# Patient Record
Sex: Female | Born: 1935 | Race: White | Hispanic: No | Smoking: Never smoker
Health system: Southern US, Community
[De-identification: ages and names within clinical notes are randomized; demographics above are authoritative.]

## PROBLEM LIST (undated history)

## (undated) DIAGNOSIS — I509 Heart failure, unspecified: Secondary | ICD-10-CM

## (undated) DIAGNOSIS — Q059 Spina bifida, unspecified: Secondary | ICD-10-CM

## (undated) DIAGNOSIS — L03119 Cellulitis of unspecified part of limb: Secondary | ICD-10-CM

## (undated) DIAGNOSIS — C8590 Non-Hodgkin lymphoma, unspecified, unspecified site: Secondary | ICD-10-CM

## (undated) DIAGNOSIS — L02419 Cutaneous abscess of limb, unspecified: Secondary | ICD-10-CM

## (undated) HISTORY — PX: OTHER SURGICAL HISTORY: SHX169

## (undated) HISTORY — PX: ABDOMINAL HYSTERECTOMY: SHX81

## (undated) HISTORY — PX: KNEE SURGERY: SHX244

---

## 1997-10-06 ENCOUNTER — Encounter: Admission: RE | Admit: 1997-10-06 | Discharge: 1998-01-04 | Payer: Self-pay | Admitting: Orthopedic Surgery

## 1998-02-20 ENCOUNTER — Ambulatory Visit (HOSPITAL_COMMUNITY): Admission: RE | Admit: 1998-02-20 | Discharge: 1998-02-20 | Payer: Self-pay | Admitting: Oncology

## 1998-05-23 ENCOUNTER — Ambulatory Visit (HOSPITAL_COMMUNITY): Admission: RE | Admit: 1998-05-23 | Discharge: 1998-05-23 | Payer: Self-pay | Admitting: Family Medicine

## 1998-05-23 ENCOUNTER — Encounter: Payer: Self-pay | Admitting: Family Medicine

## 1998-06-12 ENCOUNTER — Encounter: Payer: Self-pay | Admitting: Gastroenterology

## 1998-06-12 ENCOUNTER — Ambulatory Visit (HOSPITAL_COMMUNITY): Admission: RE | Admit: 1998-06-12 | Discharge: 1998-06-12 | Payer: Self-pay | Admitting: Gastroenterology

## 1998-12-03 ENCOUNTER — Emergency Department (HOSPITAL_COMMUNITY): Admission: EM | Admit: 1998-12-03 | Discharge: 1998-12-03 | Payer: Self-pay | Admitting: Emergency Medicine

## 1998-12-03 ENCOUNTER — Encounter: Payer: Self-pay | Admitting: Family Medicine

## 1998-12-25 ENCOUNTER — Ambulatory Visit (HOSPITAL_COMMUNITY): Admission: RE | Admit: 1998-12-25 | Discharge: 1998-12-25 | Payer: Self-pay | Admitting: Family Medicine

## 1998-12-25 ENCOUNTER — Encounter: Payer: Self-pay | Admitting: Family Medicine

## 1999-03-17 ENCOUNTER — Emergency Department (HOSPITAL_COMMUNITY): Admission: EM | Admit: 1999-03-17 | Discharge: 1999-03-17 | Payer: Self-pay | Admitting: Emergency Medicine

## 1999-03-17 ENCOUNTER — Encounter: Payer: Self-pay | Admitting: Emergency Medicine

## 1999-05-03 ENCOUNTER — Encounter: Admission: RE | Admit: 1999-05-03 | Discharge: 1999-05-03 | Payer: Self-pay | Admitting: Internal Medicine

## 1999-06-21 ENCOUNTER — Encounter: Admission: RE | Admit: 1999-06-21 | Discharge: 1999-06-21 | Payer: Self-pay | Admitting: Internal Medicine

## 1999-07-13 ENCOUNTER — Ambulatory Visit (HOSPITAL_COMMUNITY): Admission: RE | Admit: 1999-07-13 | Discharge: 1999-07-13 | Payer: Self-pay | Admitting: *Deleted

## 1999-08-16 ENCOUNTER — Encounter: Admission: RE | Admit: 1999-08-16 | Discharge: 1999-08-16 | Payer: Self-pay | Admitting: Internal Medicine

## 2000-01-01 ENCOUNTER — Encounter: Admission: RE | Admit: 2000-01-01 | Discharge: 2000-01-01 | Payer: Self-pay | Admitting: Internal Medicine

## 2000-02-25 ENCOUNTER — Encounter (INDEPENDENT_AMBULATORY_CARE_PROVIDER_SITE_OTHER): Payer: Self-pay

## 2000-02-25 ENCOUNTER — Ambulatory Visit (HOSPITAL_COMMUNITY): Admission: RE | Admit: 2000-02-25 | Discharge: 2000-02-25 | Payer: Self-pay | Admitting: Gastroenterology

## 2000-03-06 ENCOUNTER — Encounter: Payer: Self-pay | Admitting: Gastroenterology

## 2000-03-06 ENCOUNTER — Encounter: Admission: RE | Admit: 2000-03-06 | Discharge: 2000-03-06 | Payer: Self-pay | Admitting: Gastroenterology

## 2000-04-11 ENCOUNTER — Encounter (INDEPENDENT_AMBULATORY_CARE_PROVIDER_SITE_OTHER): Payer: Self-pay | Admitting: Specialist

## 2000-04-11 ENCOUNTER — Ambulatory Visit (HOSPITAL_COMMUNITY): Admission: RE | Admit: 2000-04-11 | Discharge: 2000-04-11 | Payer: Self-pay | Admitting: Gastroenterology

## 2000-06-01 ENCOUNTER — Emergency Department (HOSPITAL_COMMUNITY): Admission: EM | Admit: 2000-06-01 | Discharge: 2000-06-01 | Payer: Self-pay | Admitting: Emergency Medicine

## 2000-06-01 ENCOUNTER — Encounter: Payer: Self-pay | Admitting: *Deleted

## 2000-06-02 ENCOUNTER — Encounter: Admission: RE | Admit: 2000-06-02 | Discharge: 2000-06-02 | Payer: Self-pay | Admitting: Internal Medicine

## 2000-08-13 ENCOUNTER — Encounter: Admission: RE | Admit: 2000-08-13 | Discharge: 2000-08-13 | Payer: Self-pay | Admitting: Oncology

## 2000-08-13 ENCOUNTER — Encounter: Payer: Self-pay | Admitting: Oncology

## 2000-11-17 ENCOUNTER — Encounter: Admission: RE | Admit: 2000-11-17 | Discharge: 2000-11-17 | Payer: Self-pay | Admitting: Internal Medicine

## 2000-11-21 ENCOUNTER — Encounter: Admission: RE | Admit: 2000-11-21 | Discharge: 2000-11-21 | Payer: Self-pay | Admitting: Internal Medicine

## 2000-12-10 ENCOUNTER — Ambulatory Visit (HOSPITAL_COMMUNITY): Admission: RE | Admit: 2000-12-10 | Discharge: 2000-12-10 | Payer: Self-pay | Admitting: Obstetrics & Gynecology

## 2001-03-10 ENCOUNTER — Emergency Department (HOSPITAL_COMMUNITY): Admission: EM | Admit: 2001-03-10 | Discharge: 2001-03-11 | Payer: Self-pay | Admitting: Emergency Medicine

## 2001-03-15 ENCOUNTER — Emergency Department: Admission: EM | Admit: 2001-03-15 | Discharge: 2001-03-15 | Payer: Self-pay | Admitting: Emergency Medicine

## 2001-04-27 ENCOUNTER — Encounter: Admission: RE | Admit: 2001-04-27 | Discharge: 2001-04-27 | Payer: Self-pay | Admitting: Internal Medicine

## 2001-05-19 ENCOUNTER — Encounter: Admission: RE | Admit: 2001-05-19 | Discharge: 2001-05-19 | Payer: Self-pay | Admitting: Internal Medicine

## 2001-05-20 ENCOUNTER — Ambulatory Visit (HOSPITAL_COMMUNITY): Admission: RE | Admit: 2001-05-20 | Discharge: 2001-05-20 | Payer: Self-pay | Admitting: Internal Medicine

## 2001-05-20 ENCOUNTER — Encounter: Payer: Self-pay | Admitting: Internal Medicine

## 2001-06-19 ENCOUNTER — Encounter: Admission: RE | Admit: 2001-06-19 | Discharge: 2001-06-19 | Payer: Self-pay | Admitting: Internal Medicine

## 2001-10-22 ENCOUNTER — Encounter: Admission: RE | Admit: 2001-10-22 | Discharge: 2001-10-22 | Payer: Self-pay | Admitting: Internal Medicine

## 2001-11-30 ENCOUNTER — Emergency Department (HOSPITAL_COMMUNITY): Admission: EM | Admit: 2001-11-30 | Discharge: 2001-11-30 | Payer: Self-pay | Admitting: Emergency Medicine

## 2001-12-02 ENCOUNTER — Encounter (INDEPENDENT_AMBULATORY_CARE_PROVIDER_SITE_OTHER): Payer: Self-pay | Admitting: Specialist

## 2001-12-02 ENCOUNTER — Encounter: Payer: Self-pay | Admitting: Specialist

## 2001-12-03 ENCOUNTER — Encounter: Payer: Self-pay | Admitting: Specialist

## 2001-12-03 ENCOUNTER — Inpatient Hospital Stay (HOSPITAL_COMMUNITY): Admission: EM | Admit: 2001-12-03 | Discharge: 2001-12-06 | Payer: Self-pay | Admitting: Specialist

## 2001-12-08 ENCOUNTER — Encounter: Payer: Self-pay | Admitting: Internal Medicine

## 2001-12-08 ENCOUNTER — Inpatient Hospital Stay (HOSPITAL_COMMUNITY): Admission: EM | Admit: 2001-12-08 | Discharge: 2001-12-12 | Payer: Self-pay

## 2002-05-12 ENCOUNTER — Ambulatory Visit (HOSPITAL_COMMUNITY): Admission: RE | Admit: 2002-05-12 | Discharge: 2002-05-12 | Payer: Self-pay | Admitting: Oncology

## 2002-05-12 ENCOUNTER — Encounter: Payer: Self-pay | Admitting: Oncology

## 2002-08-19 ENCOUNTER — Encounter: Admission: RE | Admit: 2002-08-19 | Discharge: 2002-08-19 | Payer: Self-pay | Admitting: Internal Medicine

## 2002-08-23 ENCOUNTER — Encounter: Payer: Self-pay | Admitting: Orthopedic Surgery

## 2002-08-23 ENCOUNTER — Encounter: Admission: RE | Admit: 2002-08-23 | Discharge: 2002-08-23 | Payer: Self-pay | Admitting: Orthopedic Surgery

## 2002-08-24 ENCOUNTER — Encounter (INDEPENDENT_AMBULATORY_CARE_PROVIDER_SITE_OTHER): Payer: Self-pay | Admitting: Specialist

## 2002-08-24 ENCOUNTER — Ambulatory Visit (HOSPITAL_BASED_OUTPATIENT_CLINIC_OR_DEPARTMENT_OTHER): Admission: RE | Admit: 2002-08-24 | Discharge: 2002-08-25 | Payer: Self-pay | Admitting: Orthopedic Surgery

## 2002-08-27 ENCOUNTER — Inpatient Hospital Stay (HOSPITAL_COMMUNITY): Admission: RE | Admit: 2002-08-27 | Discharge: 2002-09-02 | Payer: Self-pay | Admitting: Internal Medicine

## 2002-12-03 ENCOUNTER — Encounter: Admission: RE | Admit: 2002-12-03 | Discharge: 2002-12-03 | Payer: Self-pay | Admitting: Internal Medicine

## 2002-12-15 ENCOUNTER — Ambulatory Visit (HOSPITAL_COMMUNITY): Admission: RE | Admit: 2002-12-15 | Discharge: 2002-12-15 | Payer: Self-pay | Admitting: Internal Medicine

## 2002-12-15 ENCOUNTER — Encounter: Payer: Self-pay | Admitting: Internal Medicine

## 2002-12-20 ENCOUNTER — Encounter: Admission: RE | Admit: 2002-12-20 | Discharge: 2002-12-20 | Payer: Self-pay | Admitting: Internal Medicine

## 2002-12-21 ENCOUNTER — Encounter: Admission: RE | Admit: 2002-12-21 | Discharge: 2002-12-21 | Payer: Self-pay | Admitting: Sports Medicine

## 2002-12-21 ENCOUNTER — Encounter: Payer: Self-pay | Admitting: Sports Medicine

## 2003-04-05 ENCOUNTER — Encounter: Admission: RE | Admit: 2003-04-05 | Discharge: 2003-04-05 | Payer: Self-pay | Admitting: Internal Medicine

## 2003-04-15 ENCOUNTER — Encounter: Admission: RE | Admit: 2003-04-15 | Discharge: 2003-04-15 | Payer: Self-pay | Admitting: Internal Medicine

## 2003-05-02 ENCOUNTER — Encounter: Admission: RE | Admit: 2003-05-02 | Discharge: 2003-05-02 | Payer: Self-pay | Admitting: Internal Medicine

## 2003-05-17 ENCOUNTER — Emergency Department (HOSPITAL_COMMUNITY): Admission: EM | Admit: 2003-05-17 | Discharge: 2003-05-17 | Payer: Self-pay | Admitting: Emergency Medicine

## 2003-05-27 ENCOUNTER — Ambulatory Visit (HOSPITAL_COMMUNITY): Admission: RE | Admit: 2003-05-27 | Discharge: 2003-05-27 | Payer: Self-pay | Admitting: Emergency Medicine

## 2003-07-05 ENCOUNTER — Ambulatory Visit (HOSPITAL_COMMUNITY): Admission: RE | Admit: 2003-07-05 | Discharge: 2003-07-05 | Payer: Self-pay | Admitting: Neurology

## 2003-12-16 ENCOUNTER — Ambulatory Visit (HOSPITAL_COMMUNITY): Admission: RE | Admit: 2003-12-16 | Discharge: 2003-12-16 | Payer: Self-pay | Admitting: Internal Medicine

## 2004-05-24 ENCOUNTER — Ambulatory Visit: Payer: Self-pay | Admitting: Internal Medicine

## 2004-06-08 ENCOUNTER — Ambulatory Visit: Payer: Self-pay | Admitting: Oncology

## 2004-06-08 ENCOUNTER — Ambulatory Visit: Payer: Self-pay | Admitting: Internal Medicine

## 2004-06-14 ENCOUNTER — Encounter (INDEPENDENT_AMBULATORY_CARE_PROVIDER_SITE_OTHER): Payer: Self-pay | Admitting: *Deleted

## 2004-06-14 ENCOUNTER — Ambulatory Visit: Admission: RE | Admit: 2004-06-14 | Discharge: 2004-06-14 | Payer: Self-pay | Admitting: Oncology

## 2004-06-29 ENCOUNTER — Ambulatory Visit: Payer: Self-pay | Admitting: Internal Medicine

## 2004-06-29 ENCOUNTER — Inpatient Hospital Stay (HOSPITAL_COMMUNITY): Admission: RE | Admit: 2004-06-29 | Discharge: 2004-07-05 | Payer: Self-pay | Admitting: Orthopedic Surgery

## 2004-06-29 ENCOUNTER — Ambulatory Visit: Payer: Self-pay | Admitting: Physical Medicine & Rehabilitation

## 2004-12-21 ENCOUNTER — Ambulatory Visit: Payer: Self-pay | Admitting: Internal Medicine

## 2004-12-24 ENCOUNTER — Emergency Department (HOSPITAL_COMMUNITY): Admission: EM | Admit: 2004-12-24 | Discharge: 2004-12-24 | Payer: Self-pay | Admitting: Emergency Medicine

## 2005-06-06 ENCOUNTER — Ambulatory Visit: Payer: Self-pay | Admitting: Oncology

## 2005-07-04 ENCOUNTER — Encounter: Admission: RE | Admit: 2005-07-04 | Discharge: 2005-07-04 | Payer: Self-pay | Admitting: Oncology

## 2005-07-24 ENCOUNTER — Encounter: Admission: RE | Admit: 2005-07-24 | Discharge: 2005-07-24 | Payer: Self-pay | Admitting: Oncology

## 2006-04-30 ENCOUNTER — Encounter: Admission: RE | Admit: 2006-04-30 | Discharge: 2006-04-30 | Payer: Self-pay | Admitting: Oncology

## 2006-06-21 ENCOUNTER — Emergency Department (HOSPITAL_COMMUNITY): Admission: EM | Admit: 2006-06-21 | Discharge: 2006-06-21 | Payer: Self-pay | Admitting: Emergency Medicine

## 2006-06-23 ENCOUNTER — Ambulatory Visit: Payer: Self-pay | Admitting: Oncology

## 2006-07-07 ENCOUNTER — Encounter: Admission: RE | Admit: 2006-07-07 | Discharge: 2006-07-07 | Payer: Self-pay | Admitting: Internal Medicine

## 2006-07-25 ENCOUNTER — Emergency Department (HOSPITAL_COMMUNITY): Admission: EM | Admit: 2006-07-25 | Discharge: 2006-07-25 | Payer: Self-pay | Admitting: Emergency Medicine

## 2006-07-28 LAB — CBC WITH DIFFERENTIAL/PLATELET
BASO%: 0.2 % (ref 0.0–2.0)
EOS%: 10.1 % — ABNORMAL HIGH (ref 0.0–7.0)
MCH: 30.4 pg (ref 26.0–34.0)
MCHC: 33.6 g/dL (ref 32.0–36.0)
MCV: 90.7 fL (ref 81.0–101.0)
MONO%: 7.5 % (ref 0.0–13.0)
RBC: 4.21 10*6/uL (ref 3.70–5.32)
RDW: 12.3 % (ref 11.3–14.5)
lymph#: 0.6 10*3/uL — ABNORMAL LOW (ref 0.9–3.3)

## 2006-08-18 ENCOUNTER — Inpatient Hospital Stay (HOSPITAL_COMMUNITY): Admission: EM | Admit: 2006-08-18 | Discharge: 2006-08-24 | Payer: Self-pay | Admitting: Emergency Medicine

## 2007-01-02 ENCOUNTER — Ambulatory Visit: Payer: Self-pay | Admitting: *Deleted

## 2007-01-02 ENCOUNTER — Ambulatory Visit: Admission: RE | Admit: 2007-01-02 | Discharge: 2007-01-02 | Payer: Self-pay | Admitting: Urology

## 2007-01-21 ENCOUNTER — Encounter (INDEPENDENT_AMBULATORY_CARE_PROVIDER_SITE_OTHER): Payer: Self-pay | Admitting: Internal Medicine

## 2007-01-21 ENCOUNTER — Ambulatory Visit (HOSPITAL_COMMUNITY): Admission: RE | Admit: 2007-01-21 | Discharge: 2007-01-21 | Payer: Self-pay | Admitting: Hospitalist

## 2007-01-21 ENCOUNTER — Ambulatory Visit: Payer: Self-pay | Admitting: Hospitalist

## 2007-01-21 ENCOUNTER — Encounter (INDEPENDENT_AMBULATORY_CARE_PROVIDER_SITE_OTHER): Payer: Self-pay | Admitting: *Deleted

## 2007-01-21 DIAGNOSIS — Q059 Spina bifida, unspecified: Secondary | ICD-10-CM

## 2007-01-21 DIAGNOSIS — E785 Hyperlipidemia, unspecified: Secondary | ICD-10-CM | POA: Insufficient documentation

## 2007-01-21 DIAGNOSIS — K219 Gastro-esophageal reflux disease without esophagitis: Secondary | ICD-10-CM | POA: Insufficient documentation

## 2007-01-21 DIAGNOSIS — B009 Herpesviral infection, unspecified: Secondary | ICD-10-CM | POA: Insufficient documentation

## 2007-01-21 DIAGNOSIS — R1900 Intra-abdominal and pelvic swelling, mass and lump, unspecified site: Secondary | ICD-10-CM | POA: Insufficient documentation

## 2007-01-21 DIAGNOSIS — C8589 Other specified types of non-Hodgkin lymphoma, extranodal and solid organ sites: Secondary | ICD-10-CM | POA: Insufficient documentation

## 2007-01-21 DIAGNOSIS — Z9079 Acquired absence of other genital organ(s): Secondary | ICD-10-CM | POA: Insufficient documentation

## 2007-01-21 DIAGNOSIS — N318 Other neuromuscular dysfunction of bladder: Secondary | ICD-10-CM | POA: Insufficient documentation

## 2007-01-21 DIAGNOSIS — D709 Neutropenia, unspecified: Secondary | ICD-10-CM

## 2007-01-21 DIAGNOSIS — M1990 Unspecified osteoarthritis, unspecified site: Secondary | ICD-10-CM | POA: Insufficient documentation

## 2007-01-21 DIAGNOSIS — R609 Edema, unspecified: Secondary | ICD-10-CM | POA: Insufficient documentation

## 2007-01-21 DIAGNOSIS — D259 Leiomyoma of uterus, unspecified: Secondary | ICD-10-CM | POA: Insufficient documentation

## 2007-01-22 LAB — CONVERTED CEMR LAB
ALT: 19 units/L (ref 0–35)
Albumin: 3.7 g/dL (ref 3.5–5.2)
Alkaline Phosphatase: 96 units/L (ref 39–117)
CO2: 28 meq/L (ref 19–32)
Chloride: 101 meq/L (ref 96–112)
Creatinine, Ser: 0.61 mg/dL (ref 0.40–1.20)
Pro B Natriuretic peptide (BNP): 66 pg/mL (ref 0.0–100.0)
Sodium: 137 meq/L (ref 135–145)

## 2007-01-23 ENCOUNTER — Ambulatory Visit: Payer: Self-pay | Admitting: Oncology

## 2007-01-27 LAB — COMPREHENSIVE METABOLIC PANEL
Alkaline Phosphatase: 88 U/L (ref 39–117)
BUN: 14 mg/dL (ref 6–23)
Glucose, Bld: 94 mg/dL (ref 70–99)
Sodium: 141 mEq/L (ref 135–145)
Total Bilirubin: 1.5 mg/dL — ABNORMAL HIGH (ref 0.3–1.2)

## 2007-01-27 LAB — CBC WITH DIFFERENTIAL/PLATELET
EOS%: 6.1 % (ref 0.0–7.0)
Eosinophils Absolute: 0.1 10*3/uL (ref 0.0–0.5)
LYMPH%: 44.2 % (ref 14.0–48.0)
MCH: 32.3 pg (ref 26.0–34.0)
MCV: 91.9 fL (ref 81.0–101.0)
MONO%: 15.1 % — ABNORMAL HIGH (ref 0.0–13.0)
NEUT#: 0.4 10*3/uL — CL (ref 1.5–6.5)
Platelets: 149 10*3/uL (ref 145–400)
RBC: 3.65 10*6/uL — ABNORMAL LOW (ref 3.70–5.32)

## 2007-01-27 LAB — URINALYSIS, MICROSCOPIC - CHCC
Bilirubin (Urine): NEGATIVE
Ketones: NEGATIVE mg/dL
pH: 6.5 (ref 4.6–8.0)

## 2007-01-29 LAB — URINE CULTURE

## 2007-02-03 ENCOUNTER — Encounter (INDEPENDENT_AMBULATORY_CARE_PROVIDER_SITE_OTHER): Payer: Self-pay | Admitting: Orthopaedic Surgery

## 2007-02-03 ENCOUNTER — Ambulatory Visit (HOSPITAL_COMMUNITY): Admission: RE | Admit: 2007-02-03 | Discharge: 2007-02-03 | Payer: Self-pay | Admitting: Internal Medicine

## 2007-02-04 ENCOUNTER — Encounter (INDEPENDENT_AMBULATORY_CARE_PROVIDER_SITE_OTHER): Payer: Self-pay | Admitting: *Deleted

## 2007-02-04 ENCOUNTER — Encounter (INDEPENDENT_AMBULATORY_CARE_PROVIDER_SITE_OTHER): Payer: Self-pay | Admitting: Internal Medicine

## 2007-02-04 ENCOUNTER — Ambulatory Visit: Payer: Self-pay | Admitting: Internal Medicine

## 2007-02-05 ENCOUNTER — Encounter (INDEPENDENT_AMBULATORY_CARE_PROVIDER_SITE_OTHER): Payer: Self-pay | Admitting: *Deleted

## 2007-04-13 ENCOUNTER — Telehealth: Payer: Self-pay | Admitting: *Deleted

## 2007-04-14 ENCOUNTER — Ambulatory Visit (HOSPITAL_COMMUNITY): Admission: RE | Admit: 2007-04-14 | Discharge: 2007-04-14 | Payer: Self-pay | Admitting: *Deleted

## 2007-04-14 ENCOUNTER — Ambulatory Visit: Payer: Self-pay | Admitting: *Deleted

## 2007-04-14 LAB — CONVERTED CEMR LAB
Basophils Absolute: 0 10*3/uL (ref 0.0–0.1)
HCT: 35.8 % — ABNORMAL LOW (ref 36.0–46.0)
Hemoglobin: 12.2 g/dL (ref 12.0–15.0)
Lymphocytes Relative: 41 % (ref 12–46)
MCHC: 34.2 g/dL (ref 30.0–36.0)
Monocytes Relative: 16 % — ABNORMAL HIGH (ref 3–11)
Neutro Abs: 0.3 10*3/uL — ABNORMAL LOW (ref 1.7–7.7)
Neutrophils Relative %: 19 % — ABNORMAL LOW (ref 43–77)
RBC: 3.94 M/uL (ref 3.87–5.11)

## 2007-05-04 ENCOUNTER — Ambulatory Visit: Payer: Self-pay | Admitting: Infectious Diseases

## 2007-05-04 ENCOUNTER — Telehealth (INDEPENDENT_AMBULATORY_CARE_PROVIDER_SITE_OTHER): Payer: Self-pay | Admitting: *Deleted

## 2007-05-04 ENCOUNTER — Ambulatory Visit (HOSPITAL_COMMUNITY): Admission: RE | Admit: 2007-05-04 | Discharge: 2007-05-04 | Payer: Self-pay | Admitting: Infectious Diseases

## 2007-05-04 LAB — CONVERTED CEMR LAB
AST: 20 units/L (ref 0–37)
Alkaline Phosphatase: 103 units/L (ref 39–117)
BUN: 16 mg/dL (ref 6–23)
Basophils Absolute: 0 10*3/uL (ref 0.0–0.1)
Basophils Relative: 0 % (ref 0–1)
Calcium: 9.3 mg/dL (ref 8.4–10.5)
Chloride: 101 meq/L (ref 96–112)
Creatinine, Ser: 0.6 mg/dL (ref 0.40–1.20)
Glucose, Bld: 96 mg/dL (ref 70–99)
Lymphs Abs: 0.6 10*3/uL — ABNORMAL LOW (ref 0.7–3.3)
MCHC: 33.9 g/dL (ref 30.0–36.0)
MCV: 90.8 fL (ref 78.0–100.0)
Monocytes Relative: 11 % (ref 3–11)
Neutro Abs: 0.6 10*3/uL — ABNORMAL LOW (ref 1.7–7.7)
Neutrophils Relative %: 35 % — ABNORMAL LOW (ref 43–77)
Potassium: 4.2 meq/L (ref 3.5–5.3)
Sodium: 138 meq/L (ref 135–145)
Total Bilirubin: 0.7 mg/dL (ref 0.3–1.2)
Total Protein: 6.8 g/dL (ref 6.0–8.3)

## 2007-05-19 ENCOUNTER — Ambulatory Visit: Payer: Self-pay | Admitting: Internal Medicine

## 2007-06-06 ENCOUNTER — Emergency Department (HOSPITAL_COMMUNITY): Admission: EM | Admit: 2007-06-06 | Discharge: 2007-06-06 | Payer: Self-pay | Admitting: Emergency Medicine

## 2007-07-28 ENCOUNTER — Ambulatory Visit: Payer: Self-pay | Admitting: Oncology

## 2007-07-29 ENCOUNTER — Encounter (INDEPENDENT_AMBULATORY_CARE_PROVIDER_SITE_OTHER): Payer: Self-pay | Admitting: *Deleted

## 2007-07-30 LAB — MORPHOLOGY
PLT EST: ADEQUATE
RBC Comments: NORMAL

## 2007-07-30 LAB — URINALYSIS, MICROSCOPIC - CHCC
Glucose: NEGATIVE g/dL
Nitrite: POSITIVE

## 2007-07-30 LAB — CBC WITH DIFFERENTIAL/PLATELET
BASO%: 2.1 % — ABNORMAL HIGH (ref 0.0–2.0)
EOS%: 20.8 % — ABNORMAL HIGH (ref 0.0–7.0)
MCH: 30.5 pg (ref 26.0–34.0)
MCHC: 34.6 g/dL (ref 32.0–36.0)
RDW: 10.1 % — ABNORMAL LOW (ref 11.3–14.5)
lymph#: 0.5 10*3/uL — ABNORMAL LOW (ref 0.9–3.3)

## 2007-08-01 LAB — COMPREHENSIVE METABOLIC PANEL
ALT: 12 U/L (ref 0–35)
AST: 17 U/L (ref 0–37)
Albumin: 4.3 g/dL (ref 3.5–5.2)
Calcium: 9.2 mg/dL (ref 8.4–10.5)
Chloride: 104 mEq/L (ref 96–112)
Potassium: 4 mEq/L (ref 3.5–5.3)

## 2007-09-11 ENCOUNTER — Ambulatory Visit: Payer: Self-pay | Admitting: Oncology

## 2007-09-15 LAB — CBC WITH DIFFERENTIAL/PLATELET
BASO%: 0.7 % (ref 0.0–2.0)
HCT: 38.4 % (ref 34.8–46.6)
MCHC: 34.9 g/dL (ref 32.0–36.0)
MONO#: 0.2 10*3/uL (ref 0.1–0.9)
NEUT%: 22.4 % — ABNORMAL LOW (ref 39.6–76.8)
RDW: 13 % (ref 11.3–14.5)
WBC: 1.6 10*3/uL — ABNORMAL LOW (ref 3.9–10.0)
lymph#: 0.7 10*3/uL — ABNORMAL LOW (ref 0.9–3.3)

## 2007-09-15 LAB — URINALYSIS, MICROSCOPIC - CHCC
Bilirubin (Urine): NEGATIVE
Glucose: NEGATIVE g/dL
Ketones: NEGATIVE mg/dL
Protein: NEGATIVE mg/dL
Specific Gravity, Urine: 1.015 (ref 1.003–1.035)

## 2007-09-15 LAB — MORPHOLOGY: PLT EST: ADEQUATE

## 2007-09-16 LAB — URINE CULTURE

## 2007-11-12 ENCOUNTER — Ambulatory Visit: Payer: Self-pay | Admitting: Oncology

## 2007-11-18 LAB — CBC WITH DIFFERENTIAL/PLATELET
Eosinophils Absolute: 0.5 10*3/uL (ref 0.0–0.5)
HCT: 33.9 % — ABNORMAL LOW (ref 34.8–46.6)
HGB: 11.9 g/dL (ref 11.6–15.9)
LYMPH%: 34.7 % (ref 14.0–48.0)
MONO#: 0.2 10*3/uL (ref 0.1–0.9)
NEUT#: 0.5 10*3/uL — ABNORMAL LOW (ref 1.5–6.5)
NEUT%: 24.6 % — ABNORMAL LOW (ref 39.6–76.8)
Platelets: 200 10*3/uL (ref 145–400)
RBC: 3.74 10*6/uL (ref 3.70–5.32)
WBC: 1.9 10*3/uL — ABNORMAL LOW (ref 3.9–10.0)

## 2007-11-19 ENCOUNTER — Emergency Department (HOSPITAL_COMMUNITY): Admission: EM | Admit: 2007-11-19 | Discharge: 2007-11-19 | Payer: Self-pay | Admitting: Emergency Medicine

## 2007-11-25 ENCOUNTER — Ambulatory Visit: Payer: Self-pay | Admitting: *Deleted

## 2007-12-10 ENCOUNTER — Ambulatory Visit: Payer: Self-pay | Admitting: *Deleted

## 2008-01-12 ENCOUNTER — Ambulatory Visit: Payer: Self-pay | Admitting: Oncology

## 2008-01-21 LAB — CBC WITH DIFFERENTIAL/PLATELET
Eosinophils Absolute: 0.4 10*3/uL (ref 0.0–0.5)
MONO#: 0.3 10*3/uL (ref 0.1–0.9)
NEUT#: 0.4 10*3/uL — CL (ref 1.5–6.5)
RBC: 3.92 10*6/uL (ref 3.70–5.32)
RDW: 11.9 % (ref 11.3–14.5)
WBC: 1.6 10*3/uL — ABNORMAL LOW (ref 3.9–10.0)

## 2008-01-26 ENCOUNTER — Emergency Department (HOSPITAL_COMMUNITY): Admission: EM | Admit: 2008-01-26 | Discharge: 2008-01-27 | Payer: Self-pay | Admitting: Emergency Medicine

## 2008-02-01 ENCOUNTER — Ambulatory Visit: Payer: Self-pay | Admitting: Internal Medicine

## 2008-03-07 ENCOUNTER — Ambulatory Visit: Payer: Self-pay | Admitting: Internal Medicine

## 2008-03-07 DIAGNOSIS — J069 Acute upper respiratory infection, unspecified: Secondary | ICD-10-CM | POA: Insufficient documentation

## 2008-03-22 ENCOUNTER — Ambulatory Visit: Payer: Self-pay | Admitting: Oncology

## 2008-03-24 LAB — CBC WITH DIFFERENTIAL/PLATELET
EOS%: 26.8 % — ABNORMAL HIGH (ref 0.0–7.0)
Eosinophils Absolute: 0.3 10*3/uL (ref 0.0–0.5)
MCH: 31.1 pg (ref 26.0–34.0)
MCV: 89.8 fL (ref 81.0–101.0)
MONO%: 17.4 % — ABNORMAL HIGH (ref 0.0–13.0)
NEUT#: 0.1 10*3/uL — CL (ref 1.5–6.5)
RBC: 4.07 10*6/uL (ref 3.70–5.32)
RDW: 12.8 % (ref 11.3–14.5)
lymph#: 0.5 10*3/uL — ABNORMAL LOW (ref 0.9–3.3)

## 2008-04-04 ENCOUNTER — Emergency Department (HOSPITAL_COMMUNITY): Admission: EM | Admit: 2008-04-04 | Discharge: 2008-04-04 | Payer: Self-pay | Admitting: Emergency Medicine

## 2008-04-12 ENCOUNTER — Ambulatory Visit: Payer: Self-pay | Admitting: Pulmonary Disease

## 2008-04-12 DIAGNOSIS — R634 Abnormal weight loss: Secondary | ICD-10-CM | POA: Insufficient documentation

## 2008-04-20 LAB — CBC WITH DIFFERENTIAL/PLATELET
Basophils Absolute: 0 10*3/uL (ref 0.0–0.1)
Eosinophils Absolute: 0.5 10*3/uL (ref 0.0–0.5)
HCT: 33.6 % — ABNORMAL LOW (ref 34.8–46.6)
HGB: 11.6 g/dL (ref 11.6–15.9)
LYMPH%: 33.4 % (ref 14.0–48.0)
MCV: 89.8 fL (ref 81.0–101.0)
MONO#: 0.2 10*3/uL (ref 0.1–0.9)
MONO%: 9.9 % (ref 0.0–13.0)
NEUT#: 0.5 10*3/uL — ABNORMAL LOW (ref 1.5–6.5)
NEUT%: 27.1 % — ABNORMAL LOW (ref 39.6–76.8)
Platelets: 177 10*3/uL (ref 145–400)
RBC: 3.75 10*6/uL (ref 3.70–5.32)
WBC: 1.7 10*3/uL — ABNORMAL LOW (ref 3.9–10.0)

## 2008-04-20 LAB — MORPHOLOGY: RBC Comments: NORMAL

## 2008-04-20 LAB — COMPREHENSIVE METABOLIC PANEL
Albumin: 3.7 g/dL (ref 3.5–5.2)
Alkaline Phosphatase: 97 U/L (ref 39–117)
Calcium: 9.2 mg/dL (ref 8.4–10.5)
Chloride: 105 mEq/L (ref 96–112)
Glucose, Bld: 110 mg/dL — ABNORMAL HIGH (ref 70–99)
Potassium: 3.6 mEq/L (ref 3.5–5.3)
Sodium: 141 mEq/L (ref 135–145)
Total Protein: 7.3 g/dL (ref 6.0–8.3)

## 2008-05-06 ENCOUNTER — Ambulatory Visit: Payer: Self-pay | Admitting: Pulmonary Disease

## 2008-05-06 DIAGNOSIS — J449 Chronic obstructive pulmonary disease, unspecified: Secondary | ICD-10-CM | POA: Insufficient documentation

## 2008-05-06 DIAGNOSIS — J4489 Other specified chronic obstructive pulmonary disease: Secondary | ICD-10-CM | POA: Insufficient documentation

## 2008-06-16 ENCOUNTER — Ambulatory Visit: Payer: Self-pay | Admitting: Oncology

## 2008-06-21 LAB — CBC WITH DIFFERENTIAL/PLATELET
Eosinophils Absolute: 0.2 10*3/uL (ref 0.0–0.5)
HCT: 36.2 % (ref 34.8–46.6)
LYMPH%: 48.4 % — ABNORMAL HIGH (ref 14.0–48.0)
MCV: 89.9 fL (ref 81.0–101.0)
MONO#: 0.2 10*3/uL (ref 0.1–0.9)
MONO%: 14.6 % — ABNORMAL HIGH (ref 0.0–13.0)
NEUT#: 0.2 10*3/uL — CL (ref 1.5–6.5)
NEUT%: 19.4 % — ABNORMAL LOW (ref 39.6–76.8)
Platelets: 139 10*3/uL — ABNORMAL LOW (ref 145–400)
WBC: 1.2 10*3/uL — ABNORMAL LOW (ref 3.9–10.0)

## 2008-07-21 ENCOUNTER — Ambulatory Visit: Payer: Self-pay | Admitting: Pulmonary Disease

## 2008-07-21 DIAGNOSIS — E46 Unspecified protein-calorie malnutrition: Secondary | ICD-10-CM

## 2008-07-21 DIAGNOSIS — J209 Acute bronchitis, unspecified: Secondary | ICD-10-CM

## 2008-07-25 ENCOUNTER — Telehealth (INDEPENDENT_AMBULATORY_CARE_PROVIDER_SITE_OTHER): Payer: Self-pay | Admitting: *Deleted

## 2008-08-04 ENCOUNTER — Telehealth (INDEPENDENT_AMBULATORY_CARE_PROVIDER_SITE_OTHER): Payer: Self-pay | Admitting: *Deleted

## 2008-08-11 ENCOUNTER — Ambulatory Visit: Payer: Self-pay | Admitting: Oncology

## 2008-10-31 ENCOUNTER — Ambulatory Visit: Payer: Self-pay | Admitting: Oncology

## 2008-11-02 LAB — CBC WITH DIFFERENTIAL/PLATELET
BASO%: 0.4 % (ref 0.0–2.0)
Eosinophils Absolute: 0.5 10*3/uL (ref 0.0–0.5)
HCT: 34.5 % — ABNORMAL LOW (ref 34.8–46.6)
LYMPH%: 35.2 % (ref 14.0–49.7)
MCHC: 34.4 g/dL (ref 31.5–36.0)
MCV: 90.3 fL (ref 79.5–101.0)
MONO#: 0.2 10*3/uL (ref 0.1–0.9)
MONO%: 15.4 % — ABNORMAL HIGH (ref 0.0–14.0)
NEUT%: 19.1 % — ABNORMAL LOW (ref 38.4–76.8)
Platelets: 188 10*3/uL (ref 145–400)
RBC: 3.82 10*6/uL (ref 3.70–5.45)
WBC: 1.6 10*3/uL — ABNORMAL LOW (ref 3.9–10.3)

## 2008-11-02 LAB — MORPHOLOGY: RBC Comments: NORMAL

## 2008-12-06 LAB — CBC WITH DIFFERENTIAL/PLATELET
BASO%: 0.2 % (ref 0.0–2.0)
HCT: 36 % (ref 34.8–46.6)
LYMPH%: 32.9 % (ref 14.0–49.7)
MCH: 30.4 pg (ref 25.1–34.0)
MCHC: 33.8 g/dL (ref 31.5–36.0)
MCV: 90 fL (ref 79.5–101.0)
MONO%: 18.2 % — ABNORMAL HIGH (ref 0.0–14.0)
NEUT%: 29.8 % — ABNORMAL LOW (ref 38.4–76.8)
Platelets: 151 10*3/uL (ref 145–400)
RBC: 4 10*6/uL (ref 3.70–5.45)

## 2008-12-06 LAB — MORPHOLOGY

## 2009-01-20 IMAGING — CR DG CHEST 2V
2 series · 2 of 2 positions shown · non-contrast
Comparison: 08/21/06.

CLINICAL DATA: Cough, shortness of breath. 
 CHEST ? 2 VIEW:

[w chest pa]
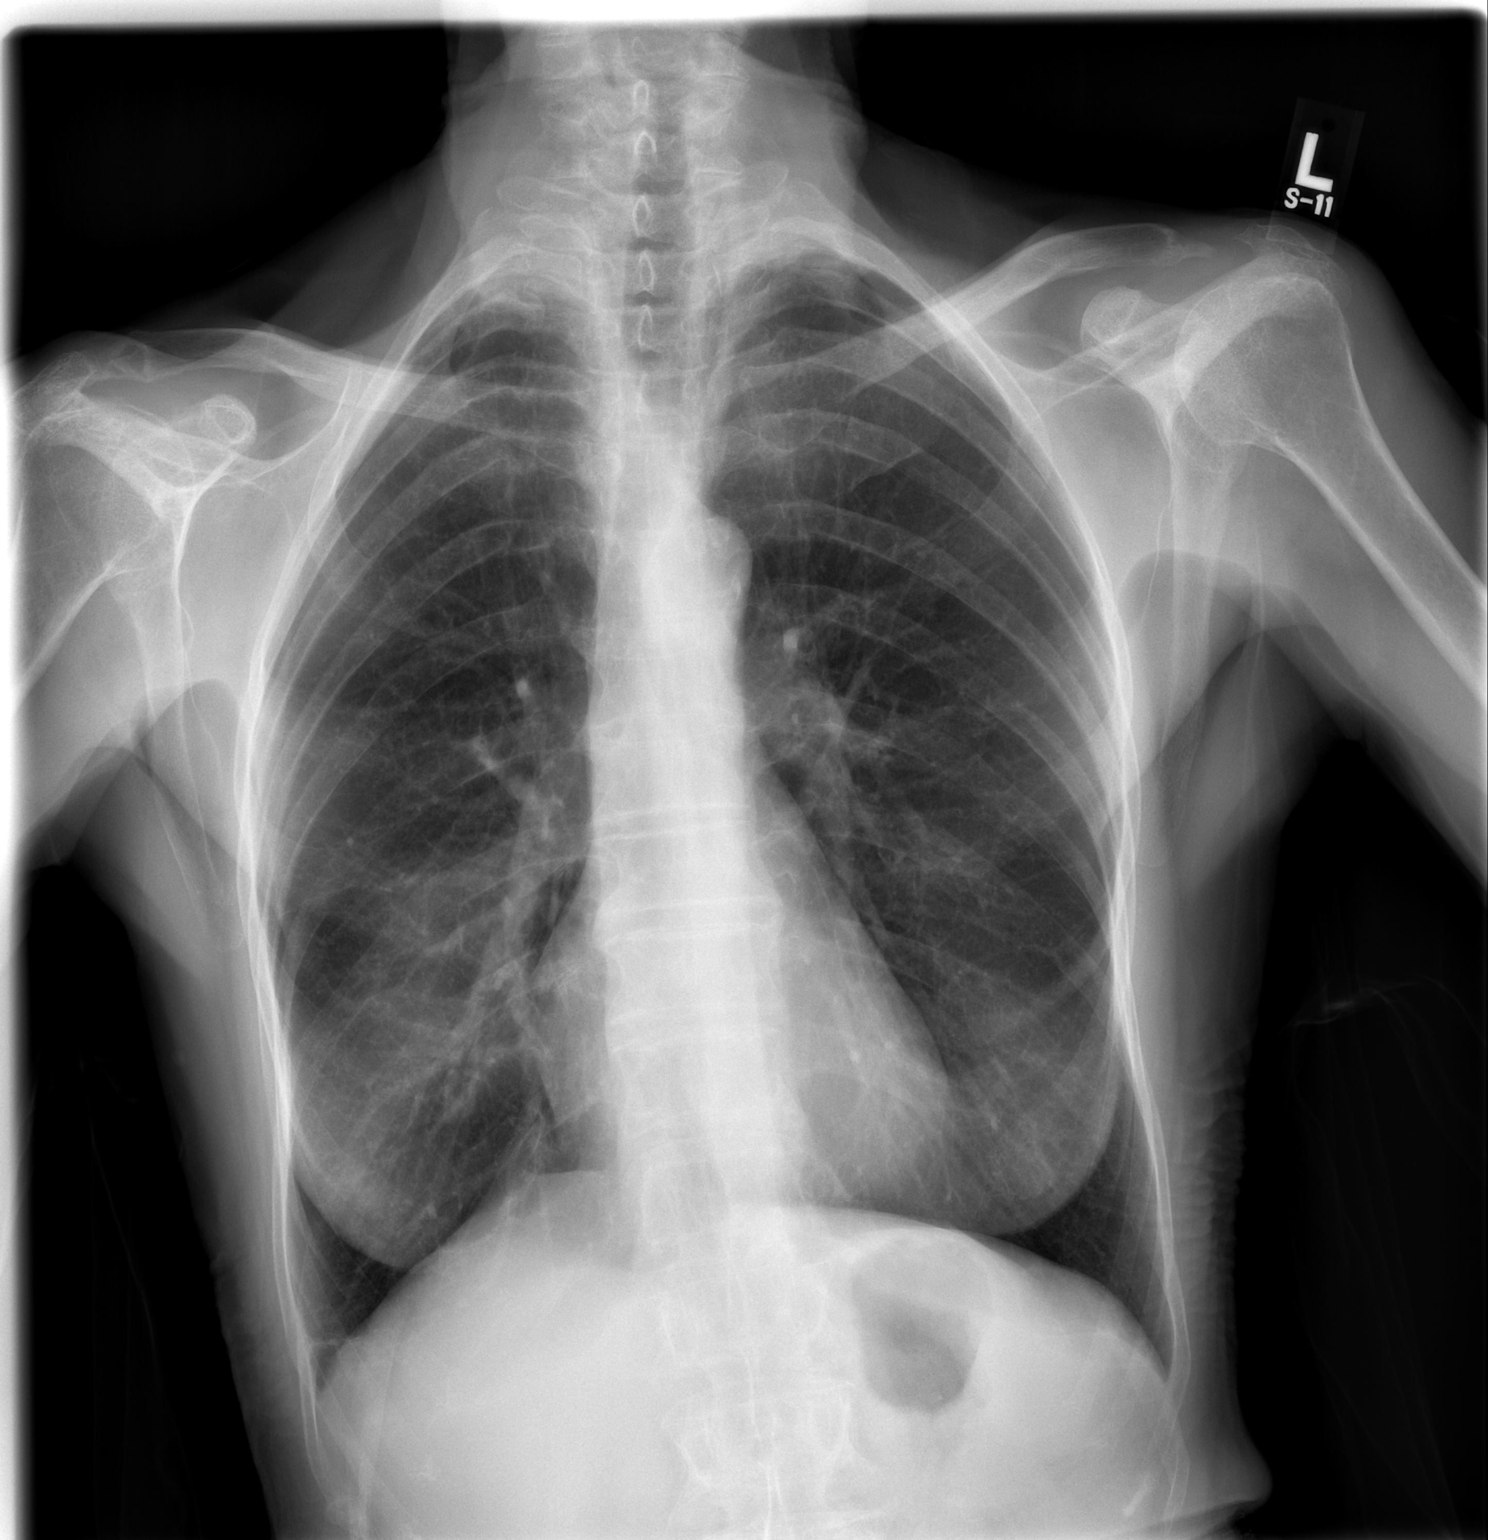

[w chest lat]
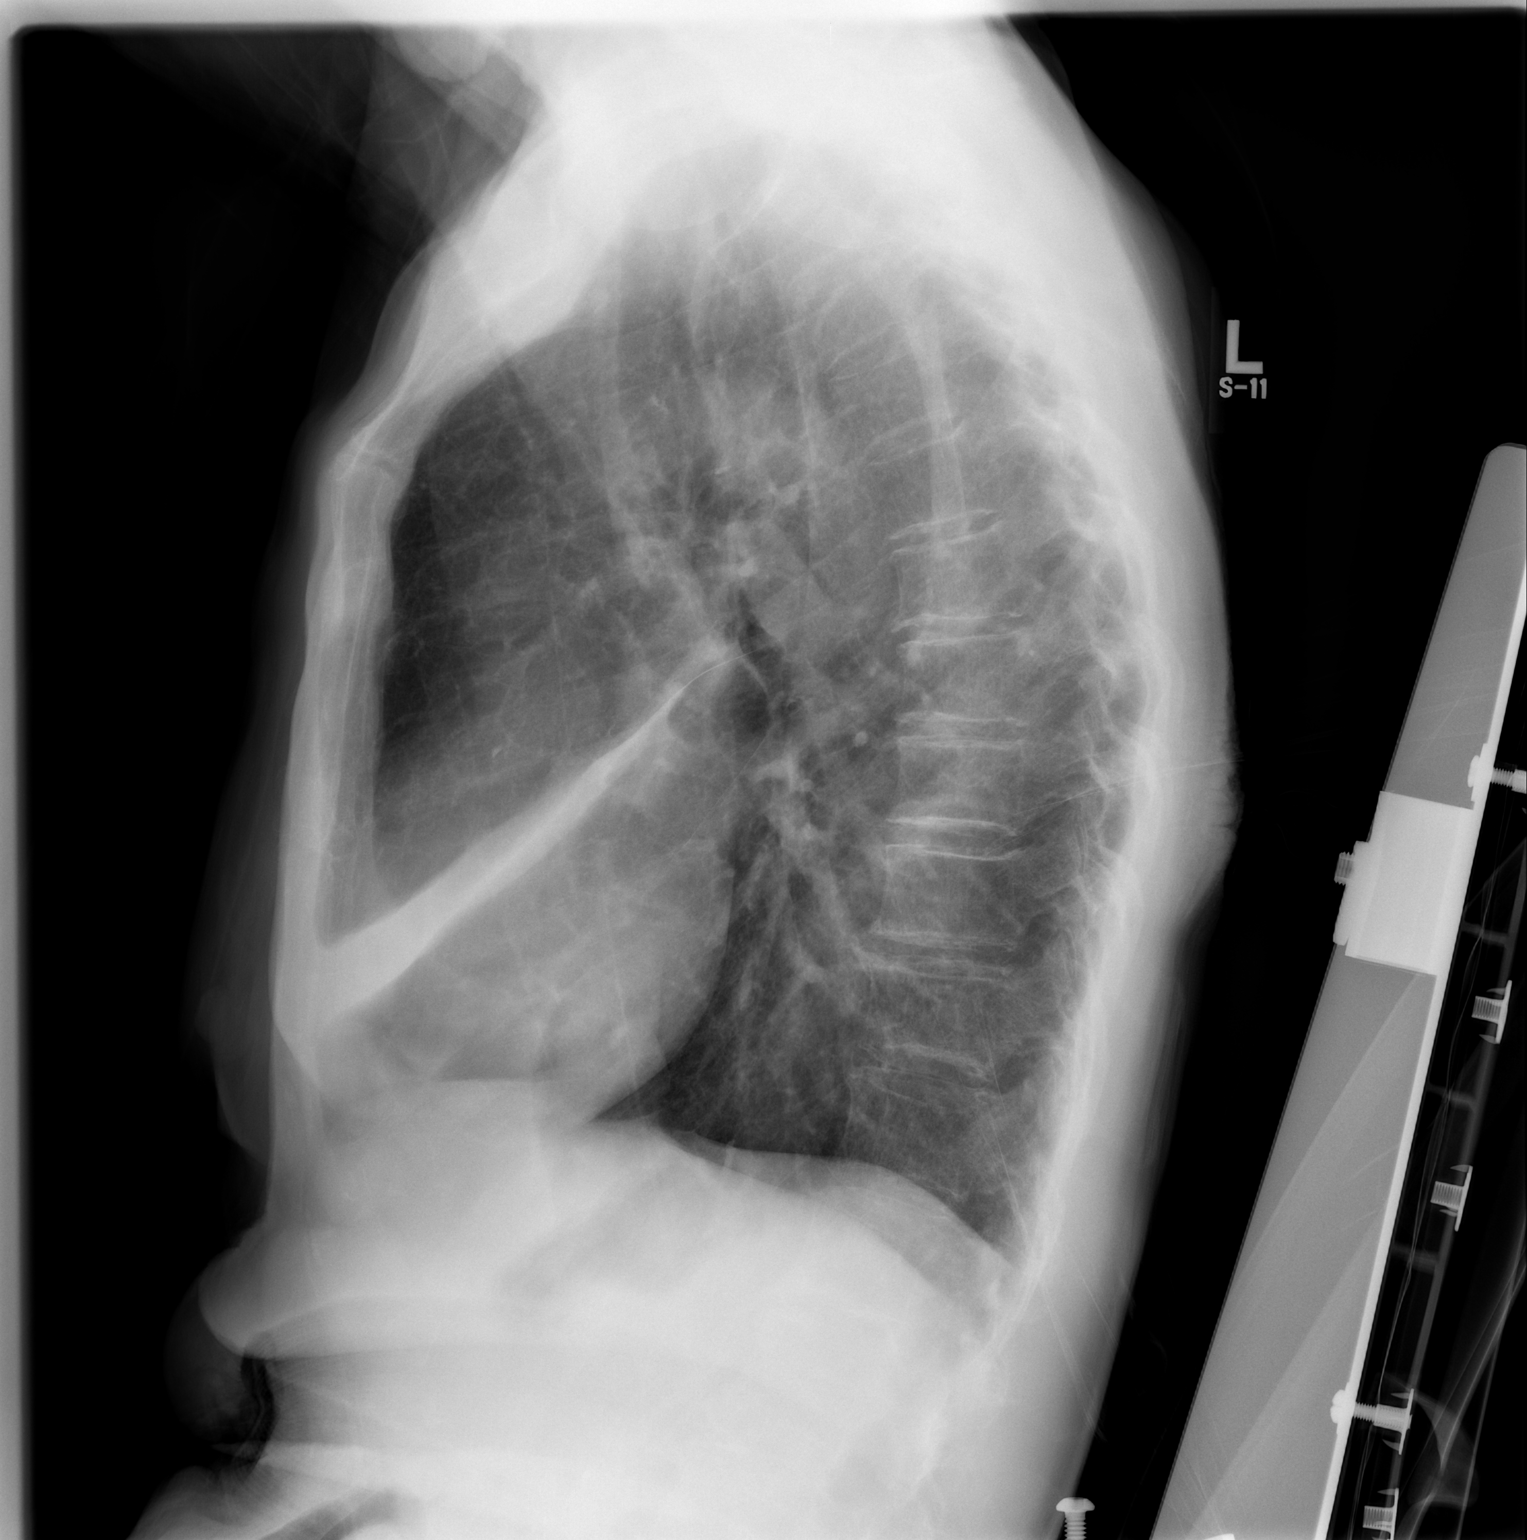

[2 of 2 positions shown; findings below may reference images not displayed]

FINDINGS: Hyperinflation suggests COPD.  Heart size is normal.  Pleural thickening versus fluid tracks along the right major fissure.  There may also be a component of lingular atelectasis.  Left lung is clear.  
 Left acromioclavicular joint posttraumatic versus postoperative change noted.
IMPRESSION: 1.  Pleural thickening, possibly with lingular atelectasis.  
 2.  COPD.

## 2009-01-24 ENCOUNTER — Emergency Department (HOSPITAL_COMMUNITY): Admission: EM | Admit: 2009-01-24 | Discharge: 2009-01-24 | Payer: Self-pay | Admitting: Emergency Medicine

## 2009-01-26 ENCOUNTER — Ambulatory Visit: Payer: Self-pay | Admitting: Oncology

## 2009-01-27 LAB — CBC WITH DIFFERENTIAL/PLATELET
BASO%: 0.6 % (ref 0.0–2.0)
EOS%: 16.4 % — ABNORMAL HIGH (ref 0.0–7.0)
LYMPH%: 30.8 % (ref 14.0–49.7)
MCH: 30.6 pg (ref 25.1–34.0)
MCHC: 34 g/dL (ref 31.5–36.0)
MCV: 90.1 fL (ref 79.5–101.0)
MONO#: 0.3 10*3/uL (ref 0.1–0.9)
MONO%: 14.8 % — ABNORMAL HIGH (ref 0.0–14.0)
NEUT%: 37.4 % — ABNORMAL LOW (ref 38.4–76.8)
Platelets: 175 10*3/uL (ref 145–400)
RBC: 4.1 10*6/uL (ref 3.70–5.45)
WBC: 1.8 10*3/uL — ABNORMAL LOW (ref 3.9–10.3)

## 2009-01-27 LAB — MORPHOLOGY: RBC Comments: NORMAL

## 2009-03-14 IMAGING — CT CT CERVICAL SPINE W/O CM
3 of 7 series · 10 of 33 positions shown, 12 images · IV contrast (agent unspecified)
Comparison: 07/25/06.
COMPARISON: 07/25/06.

CLINICAL DATA: 76-year-old fell.  
 HEAD CT WITHOUT CONTRAST:
TECHNIQUE: Contiguous axial images were obtained from the base of the skull through the vertex according to standard protocol without contrast.
TECHNIQUE: Multidetector CT imaging of the cervical spine was performed.  Multiplanar   CT image reconstructions were also generated.

[Series 600: cor c-spine · coronal · 0.37mm/px · 3 of 43 slices shown]
[im 9/43  bone]
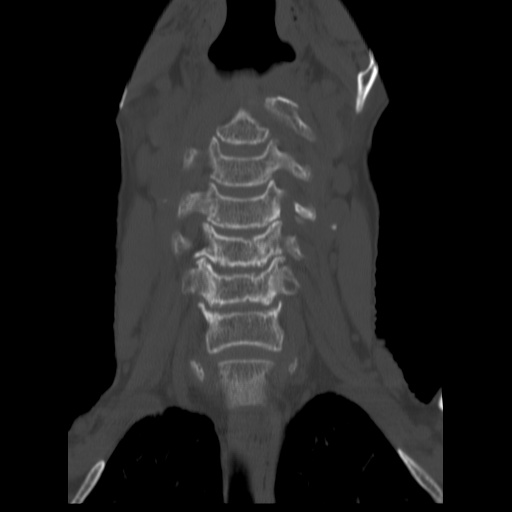
[im 17/43  bone]
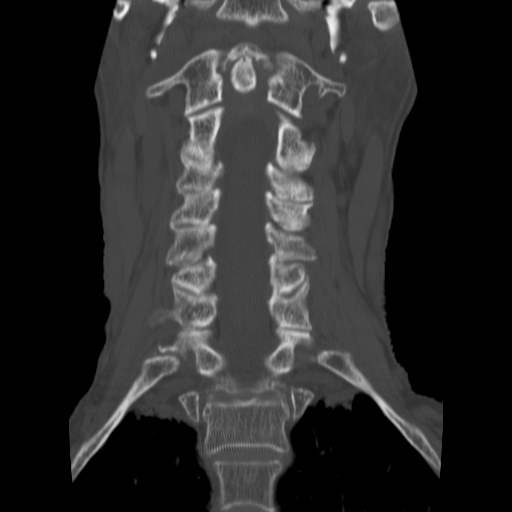
[im 26/43  bone]
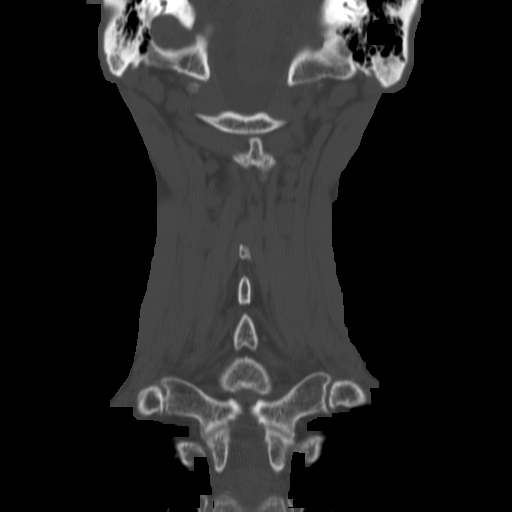

[Series 601: orhto c-spine · axial · 0.37mm/px · z∈[-71,-16]mm · 2 of 89 slices shown, 3 images]
[im 30/89  soft-tissue]
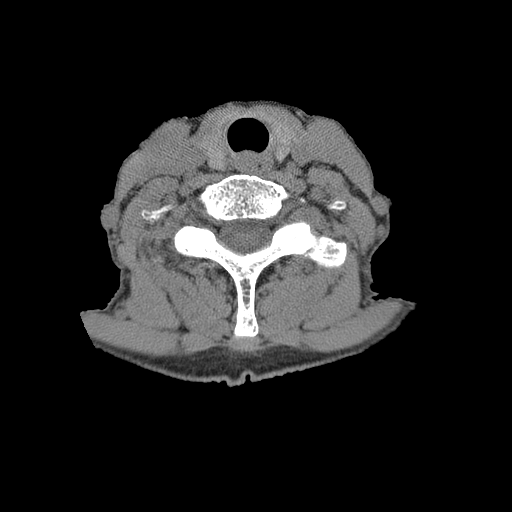
[im 30/89  bone]
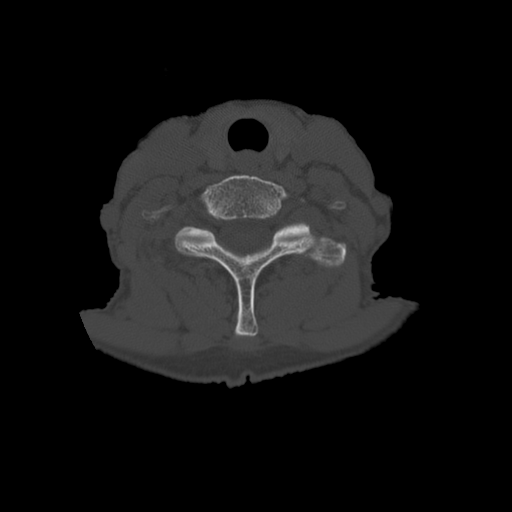
[im 59/89  bone]
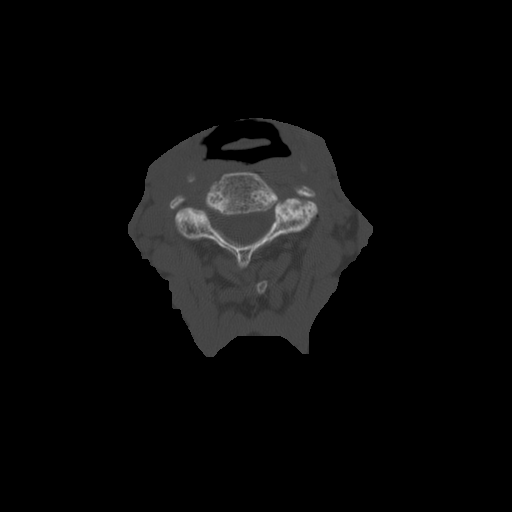

[Series 602: sag c-spine · sagittal · 0.37mm/px · 5 of 45 slices shown, 6 images]
[im 15/45  bone]
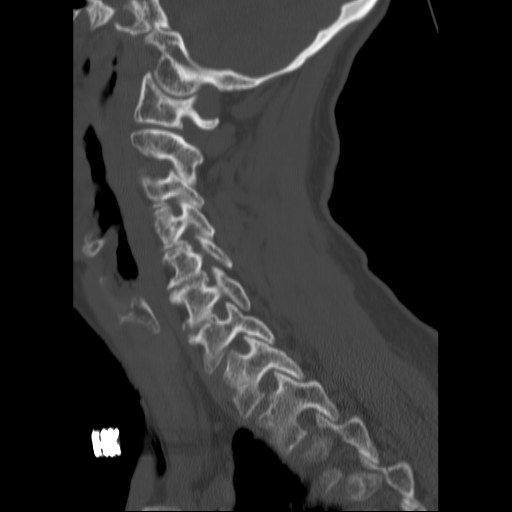
[im 19/45  bone]
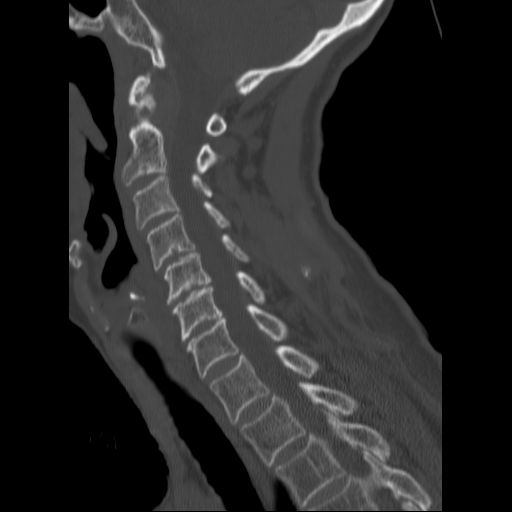
[im 23/45  soft-tissue]
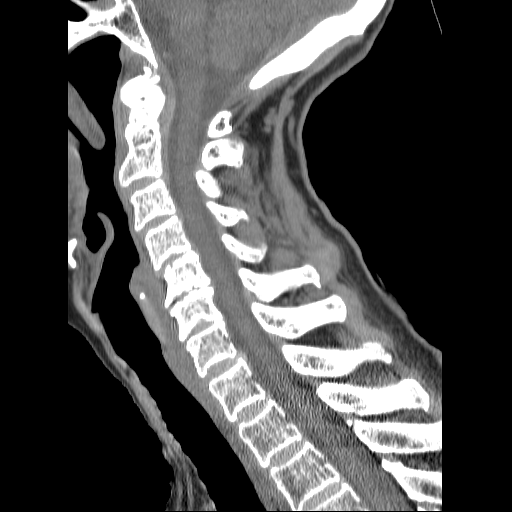
[im 23/45  bone]
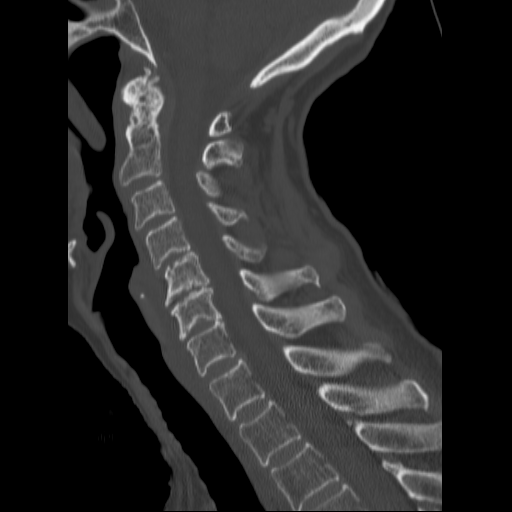
[im 26/45  bone]
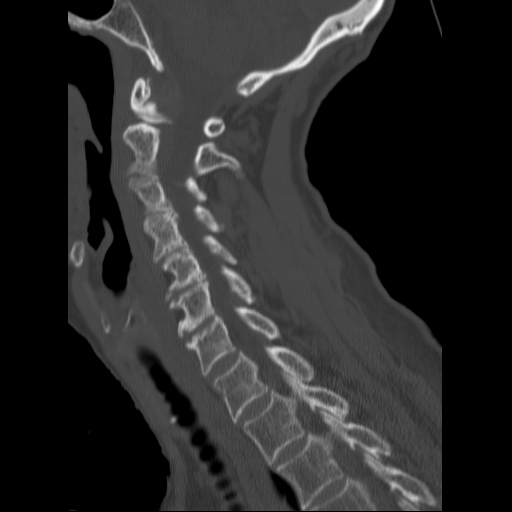
[im 30/45  bone]
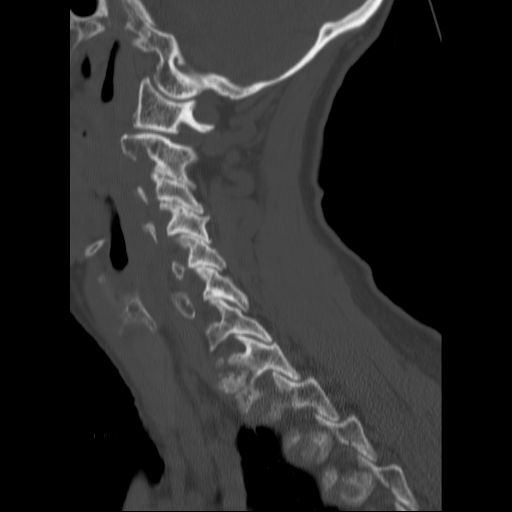

[10 of 33 positions shown; findings below may reference images not displayed]

FINDINGS: The ventricles are in the midline without mass effect or shift.  They are normal and stable in size and configuration.  No extraaxial fluid collections are seen.  
 Fairly extensive periventricular white matter disease which is likely due to microvascular ischemic change.  This has not significantly changed.  No evidence for acute intracranial abnormality and no intracranial mass lesions.  Remote lacunar-type infarct noted in the basal ganglia on the right. 
 The brainstem and cerebellum are grossly normal.  
 The bony calvarium is intact.  The visualized paranasal sinuses and mastoid air cells are clear.  Incidental note is made of a dehiscent jugular bulb on the right side.
IMPRESSION: 1.  No acute intracranial findings.
 2.  No skull fracture. 
 3.  Extensive periventricular white matter disease, stable.
 4.  Remote lacunar-type infarct in the right basal ganglia region.  
 5.  Dehiscent jugular bulb on the right side. 
 CERVICAL SPINE   CT WITHOUT CONTRAST:
FINDINGS: Sagittal images demonstrate degenerative cervical spondylosis with degenerative disc disease and degenerative facet disease which is fairly significant but unchanged since prior study.   No acute bony findings.  No abnormal prevertebral soft tissue swelling.  Advanced facet disease but no facet or laminar fractures.  There is mild multilevel bony foraminal narrowing due to uncinate spurring and facet disease.   This is a stable finding.  
 Dense apical scarring changes in the lungs.
IMPRESSION: 1.  Advanced degenerative cervical spondylosis with degenerative disc disease and degenerative facet disease unchanged since prior examination.  
 2.  Normal overall alignment and no acute bony findings or abnormal prevertebral soft tissue swelling.

## 2009-03-24 ENCOUNTER — Ambulatory Visit: Payer: Self-pay | Admitting: Oncology

## 2009-03-28 LAB — MORPHOLOGY: RBC Comments: NORMAL

## 2009-03-28 LAB — CBC WITH DIFFERENTIAL/PLATELET
BASO%: 0 % (ref 0.0–2.0)
Eosinophils Absolute: 0.3 10*3/uL (ref 0.0–0.5)
MCHC: 31.9 g/dL (ref 31.5–36.0)
MCV: 91.7 fL (ref 79.5–101.0)
MONO#: 0.2 10*3/uL (ref 0.1–0.9)
MONO%: 16.4 % — ABNORMAL HIGH (ref 0.0–14.0)
NEUT#: 0.3 10*3/uL — CL (ref 1.5–6.5)
RBC: 4.21 10*6/uL (ref 3.70–5.45)
RDW: 12.4 % (ref 11.2–14.5)
WBC: 1.5 10*3/uL — ABNORMAL LOW (ref 3.9–10.3)

## 2009-04-28 ENCOUNTER — Ambulatory Visit: Payer: Self-pay | Admitting: Oncology

## 2009-05-02 LAB — CBC WITH DIFFERENTIAL/PLATELET
EOS%: 12.7 % — ABNORMAL HIGH (ref 0.0–7.0)
MCH: 31.4 pg (ref 25.1–34.0)
MCV: 90.6 fL (ref 79.5–101.0)
MONO%: 18.1 % — ABNORMAL HIGH (ref 0.0–14.0)
RBC: 4.12 10*6/uL (ref 3.70–5.45)
RDW: 12.1 % (ref 11.2–14.5)

## 2009-06-28 ENCOUNTER — Ambulatory Visit: Payer: Self-pay | Admitting: Oncology

## 2009-07-10 ENCOUNTER — Encounter: Payer: Self-pay | Admitting: Internal Medicine

## 2009-07-10 ENCOUNTER — Inpatient Hospital Stay (HOSPITAL_COMMUNITY): Admission: EM | Admit: 2009-07-10 | Discharge: 2009-07-13 | Payer: Self-pay | Admitting: Emergency Medicine

## 2009-07-10 ENCOUNTER — Ambulatory Visit: Payer: Self-pay | Admitting: Internal Medicine

## 2009-07-11 ENCOUNTER — Ambulatory Visit: Payer: Self-pay | Admitting: Oncology

## 2009-07-13 ENCOUNTER — Encounter: Payer: Self-pay | Admitting: Internal Medicine

## 2009-07-26 ENCOUNTER — Ambulatory Visit: Payer: Self-pay | Admitting: Internal Medicine

## 2009-07-26 DIAGNOSIS — J13 Pneumonia due to Streptococcus pneumoniae: Secondary | ICD-10-CM | POA: Insufficient documentation

## 2009-07-26 DIAGNOSIS — J984 Other disorders of lung: Secondary | ICD-10-CM

## 2009-07-26 LAB — CONVERTED CEMR LAB
Eosinophils Relative: 6 % — ABNORMAL HIGH (ref 0–5)
Lymphocytes Relative: 13 % (ref 12–46)
MCHC: 31.3 g/dL (ref 30.0–36.0)
Monocytes Relative: 8 % (ref 3–12)
RBC: 4.01 M/uL (ref 3.87–5.11)
RDW: 13.4 % (ref 11.5–15.5)

## 2009-08-04 ENCOUNTER — Ambulatory Visit: Payer: Self-pay | Admitting: Oncology

## 2009-08-04 LAB — CBC WITH DIFFERENTIAL/PLATELET
BASO%: 0.7 % (ref 0.0–2.0)
HGB: 12.1 g/dL (ref 11.6–15.9)
MCH: 30.8 pg (ref 25.1–34.0)
MONO#: 0.1 10*3/uL (ref 0.1–0.9)
NEUT#: 0.1 10*3/uL — CL (ref 1.5–6.5)
Platelets: 193 10*3/uL (ref 145–400)
RDW: 13.8 % (ref 11.2–14.5)
WBC: 1.1 10*3/uL — ABNORMAL LOW (ref 3.9–10.3)
lymph#: 0.6 10*3/uL — ABNORMAL LOW (ref 0.9–3.3)

## 2009-09-29 ENCOUNTER — Ambulatory Visit: Payer: Self-pay | Admitting: Oncology

## 2009-09-29 LAB — CBC WITH DIFFERENTIAL/PLATELET
BASO%: 0.2 % (ref 0.0–2.0)
Basophils Absolute: 0 10*3/uL (ref 0.0–0.1)
HCT: 37 % (ref 34.8–46.6)
LYMPH%: 51.1 % — ABNORMAL HIGH (ref 14.0–49.7)
NEUT#: 0.2 10*3/uL — CL (ref 1.5–6.5)
Platelets: 166 10*3/uL (ref 145–400)
RDW: 12.8 % (ref 11.2–14.5)
WBC: 1.4 10*3/uL — ABNORMAL LOW (ref 3.9–10.3)

## 2009-11-01 ENCOUNTER — Ambulatory Visit: Payer: Self-pay | Admitting: Oncology

## 2009-11-02 LAB — CBC WITH DIFFERENTIAL/PLATELET
BASO%: 0.6 % (ref 0.0–2.0)
Eosinophils Absolute: 0.5 10*3/uL (ref 0.0–0.5)
HCT: 36.6 % (ref 34.8–46.6)
HGB: 11.7 g/dL (ref 11.6–15.9)
MCH: 29.7 pg (ref 25.1–34.0)
MCV: 92.9 fL (ref 79.5–101.0)
MONO#: 0.3 10*3/uL (ref 0.1–0.9)
MONO%: 16.5 % — ABNORMAL HIGH (ref 0.0–14.0)
NEUT%: 11.4 % — ABNORMAL LOW (ref 38.4–76.8)
Platelets: 161 10*3/uL (ref 145–400)
RBC: 3.94 10*6/uL (ref 3.70–5.45)
lymph#: 0.6 10*3/uL — ABNORMAL LOW (ref 0.9–3.3)

## 2010-02-13 ENCOUNTER — Emergency Department (HOSPITAL_COMMUNITY): Admission: EM | Admit: 2010-02-13 | Discharge: 2010-02-13 | Payer: Self-pay | Admitting: Emergency Medicine

## 2010-05-03 ENCOUNTER — Ambulatory Visit: Payer: Self-pay | Admitting: Oncology

## 2010-07-11 ENCOUNTER — Ambulatory Visit: Payer: Self-pay | Admitting: Oncology

## 2010-07-29 ENCOUNTER — Encounter: Payer: Self-pay | Admitting: Oncology

## 2010-08-09 NOTE — Miscellaneous (Signed)
Summary: hospital discharge (pneumonia)   Hospital Discharge  Date of admission: 07/10/2009   Date of discharge: 07/13/2009  Brief reason for admission/active problems: Community acquired Pneumonia  Followup needed:  Patient is discharged on 5 more days of avelox. Please followup with completion of that and resolution of pneumonia. She had pleuritic chest pain ( ACS ruled out )  treated with ibuprofen. Inquire about her chest pain as well and that she is not taking too much of ibuprofen and she is not having GI upset. She is also having benign neutropenia treated with neupogen in case of severe illness. She had one dose this admission. Goal WBC for her is 5000 as per her Dr. Russ Halo whom she follows up with on regualr bases.  The medication and problem lists have been updated.  Please see the dictated discharge summary for details.   Medications: Removed medication of ZITHROMAX Z-PAK 250 MG TABS (AZITHROMYCIN) use as directed Added new medication of AVELOX 400 MG TABS (MOXIFLOXACIN HCL) once daily - Signed Added new medication of IBUPROFEN 200 MG TABS (IBUPROFEN) two times a day - Signed Changed medication from IBUPROFEN 200 MG TABS (IBUPROFEN) two times a day to IBUPROFEN 200 MG TABS (IBUPROFEN) two times a day for next 2 days. Then take as needed for chest pain Rx of AVELOX 400 MG TABS (MOXIFLOXACIN HCL) once daily;  #5 x 0;  Signed;  Entered by: Bethel Born MD;  Authorized by: Bethel Born MD;  Method used: [facility]  [address] [id number] [address] [place], [place]  [address], Ph: [phone] or [phone], Fax: [phone] Rx of IBUPROFEN 200 MG TABS (IBUPROFEN) two times a day;  #30 x 0;  Signed;  Entered by: Bethel Born MD;  Authorized by: Bethel Born MD;  Method used: [facility]  [address] [id number] [address] [place], [place]  [address], Ph: [phone] or [phone], Fax: [phone] Rx of DITROPAN XL 10 MG  TB24  (OXYBUTYNIN CHLORIDE) Take 1 tablet by mouth once a day;  #30 x 1;  Signed;  Entered by: Bethel Born MD;  Authorized by: Bethel Born MD;  Method used: [facility]ically [facility]  [address] [id number] [address], [place], [place]  [address], Ph: [phone] or [phone], Fax: [phone] Rx of ENSURE HIGH PROTEIN  LIQD (NUTRITIONAL SUPPLEMENTS) one can two times a day;  #60 x 2;  Signed;  Entered by: Bethel Born MD;  Authorized by: Bethel Born MD;  Method used: [facility]  [address] [id number] [address] [place], [place]  [address], Ph: [phone] or [phone], Fax: [phone] Observations: Added new observation of INSTRUCTIONS: - Your next appointment is on the Jan 13th, 2011 with Dr. Clent Ridges at 1:30 PM - Follow up with Dr. Russ Halo soon to discuss your recent hospitalization (07/13/2009 14:14)    Prescriptions: ENSURE HIGH PROTEIN  LIQD (NUTRITIONAL SUPPLEMENTS) one can two times a day  #60 x 2   Entered and Authorized by:   Bethel Born MD   Signed by:   Bethel Born MD on 07/13/2009   Method used:   [facility]ronically to        [facility] [id number] ([facility]ail)       [address]       [place], [place]  [address]       Ph: [phone] or [phone]       Fax: [phone]   RxID:  [id number] DITROPAN XL 10 MG  TB24 (OXYBUTYNIN CHLORIDE) Take 1 tablet by mouth once a day  #30 x 1   Entered and Authorized by:   Bethel Born MD   Signed by:   Bethel Born MD on 07/13/2009   Method used:   [facility]ronically to        [id number] ([facility]ail)       [address]       [place], [place]  [address]       Ph: [phone] or [phone]       Fax: [phone]   RxID:   [id number] IBUPROFEN 200 MG TABS (IBUPROFEN) two times a day  #30 x 0   Entered and Authorized by:   Bethel Born MD   Signed by:   Bethel Born MD on 07/13/2009   Method used:   [facility]ically to        [id number] ([facility]ail[facility]       [address]       [place], [place]  [address]       Ph: [phone] or [phone]       Fax: [phone]   RxID:   [id number] AVELOX 400 MG TABS (MOXIFLOXACIN HCL) once daily  #5 x 0   Entered and Authorized by:   Bethel Born MD   Signed by:   Bethel Born MD on 07/13/2009   Method used:   [facility]ronically to        [facility] [id number] ([facility]ail[facility]       [address]       [place], [place]  [address]       Ph: [phone] or [phone]       Fax: [phone]   RxID:   [id number]    Patient Instructions: 1)  - Your next appointment is on the Jan 13th, 2011 with Dr. Clent Ridges at 1:30 PM 2)  - Follow up with Dr. Russ Halo soon to discuss your recent hospitalization

## 2010-08-09 NOTE — Miscellaneous (Signed)
Summary: Hospital Admission 1  INTERNAL MEDICINE ADMISSION HISTORY AND PHYSICAL  First Contact : Dr Scot Dock: [phone] Second Contact: Dr Sherryll Burger: [phone]   PCP: Dr Polly Cobia  AV:WUJWJ Pain.  HPI: 77 year old with PMH benigh neutropenia from unknown cause followed by Dr. Arlice Colt,    no previous cardiac condition comes to the Ed with CC of chest pain that started at [facility] oday morning. Pain in the the left side of chest, felt as a preassure, was 8/10 at onset, not radiating to arms/back or neck, aggravated by deep breaths and exertion and releived by nothing specific. She was feeling sick for 2-3 days prior to the onset of pain,. She was having dry cough, runny nose and malaise. The cough was not associated with any sputum production and was persistent through out the day without any aggaravation with lying down position.  She had mild shortness of breath as well. She had albuterol inhaler which she used during this period which helped with her cough a little bit. She says she has frequent episodes of bronchitis treated with short course of Abx and inhalers.   Denies wheezing, fevers, chills, travel, sick contact, smoking history, previous similar kind of chest pain, acid reflux. She did not try anything for the chest pain at home and came to the Ed. She was given dilaudid in the Ed which releived the pain.   ALLERGIES: PCN VICODIN  PAST MEDICAL HISTORY:  DJD Spina Bifida: wheelchair bound.  Overactive bladder Neutropenia: Extensive evalaution done by Valentino Hue. Magrinat, M.D as per my phone discussion with no appaerent cause foud. Is treated with granulocyte transfusion in case she needs procedure or is severily ill without any definite treatment. 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    MEDICATIONS:   OXYBUTYNIN CHLORIDE Take 1 tablet by mouth once a day PROAIR HFA 108 (90 BASE) MCG/ACT  AERS (ALBUTEROL SULFATE) Take  2 puffs every 4-6  hours, as needed. AMITRIPTYLINE HCL 50 MG TABS (AMITRIPTYLINE HCL) 1 at bedtime SYMBICORT 160-4.5 MCG/ACT AERO (BUDESONIDE-FORMOTEROL FUMARATE) two puffs two times a day  ENSURE HIGH PROTEIN  LIQD (NUTRITIONAL SUPPLEMENTS) one can two times a day   SOCIAL HISTORY: Social History: Lives with husband and has a Information systems manager that stays with her. 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  Never smoked, drank alvcohol or did drugs [employer]    FAMILY HISTORY Family History:  Father: CAD and throat cancer, sister with cancer, died of PNA 01/01/08 Brother with stroke   ROAS: As per HPI  VITALS:  T:  98.8 BP 139/54      HR : 87  R: 20  Sat : 99 on 2L   PHYSICAL EXAM: General:  alert, thin malnourished, , NAD Head:  normocephalic and atraumatic.   Eyes:  Lt eye complete blindness, pupils equal, pupils round, pupils reactive to light, no injection and anicteric.   Mouth:  pharynx pink and moist, no erythema, and no exudates.   Neck:  supple,  no thyromegaly, no JVD, and no carotid bruits.   Lungs:  normal respiratory effort, no accessory muscle use, normal breath sounds,crackles right lower lobe, and no wheezes. Heart:  normal rate, regular rhythm, no murmur, no gallop, and no rub.   Abdomen:  Firm, non-tender, normal bowel sounds, no distention, no guarding, no rebound tenderness, no hepatomegaly, and no splenomegaly.   Msk:  no joint swelling, no joint warmth, and  no redness over joints.   Pulses:  2+ DP/PT pulses bilaterally Extremities:  No cyanosis, clubbing, edema Neurologic:  alert & oriented X3, cranial nerves III-XII intact, strength normal in upper extremities, sensation intact to light touch, inability to raise lower extremity- minimal movements ( 1/5), chronic due to spina bifida. Skin:  turgor normal and no rashes.   Psych:  Oriented X3, memory intact for recent and remote, normally interactive, good eye contact, not anxious  appearing, and not depressed appearing.      Labs   WBC                                       3.8         RBC                                      4.05             Hemoglobin (HGB)                         12.6             Hematocrit (HCT)                         36.8         MCV                                      90.7              MCHC                                     34.2            RDW                                      12.9            Platelet Count (PLT)                     158               Neutrophils, %                           81        Lymphocytes, %                           10         Monocytes, %                             8             Eosinophils, %                           1  Basophils, %                             0                 Neutrophils, Absolute                    3.0              Lymphocytes, Absolute                    0.4        Monocytes, Absolute                      0.3              Basophils, Absolute                      0.0         D-Dimer, Fibrin Derivatives              1.22    CKMB, POC                                1.4               1.0-8.0          ng/mL   Troponin I, POC                          <0.05             0.00-0.09        ng/mL  Myoglobin, POC                           83.9              12-200           ng/mL   Sodium (NA)                              136               Potassium (K)                           2.9         Chloride                                101              CO2                                     26                 Glucose                                 115      BUN  11                Creatinine                              0.56                        UA: WBC / HPF                                3-6               <3               WBC/hpf  Bacteria / HPF                          FEW        a      RARE   pH                                        5.5            Urine Glucose                            NEGATIVE         Bilirubin                                SMALL     Ketones                                  NEGATIVE          Blood                                    NEGATIVE         Protein                                  30       Urobilinogen                             1.0              Nitrite                                  NEGATIVE          Leukocytes   CT Angio Chest:   1.  No evidence for pulmonary emboli.   2.  Thoracic aorta unremarkable.   3.  Abnormal density in the base of the left upper lobe.  Favor   pneumonia over infiltrating mass.  Careful follow-up warranted.   See report.  Chest Xray:   1.  Mass-like opacity in the inferior left upper lobe, new since   July, 2010.  ASSESSMENT AND PLAN:  (1)Chest pain:  Give h/o of upper respiratory symptoms since 2 days, CXR findings and pleuritic pain, pneumonia is a very likely consideration. But important things to rule out are ACS ( but less likely in the differential due to atypical chest pain and first 2 set cardiac enzimes negative)  and PE ( ruled out with the normal CT angio). She does not have crackles or significant SOB and past cardiac history, soi am not considering CHF as the cause of chest pain. Will start ceftriaxone and Azithro. sputum culture, legionella antigen in urine. She will need repeated ches x ray or CT to follow resolution of consolidation to rule out malignacy given the mass like apperance. .  (2)Neutropenia: stable, at baseline. Will get records from Dr. Arlice Colt  (3) Hypokalemia: Unclear etiology. Will get MG level. She denies nausea, vomiting, diarrhea. Will replete with 40 meq by mouth and 10 meq IV times 2 runs and recheck level.   ( 4) Black stools: she complaints of occasional blood and black stools. Not taking iron pills. No constipation/diarrhea. Had a normal colonoscopy as per patient a year ago. Will check FOBT.   (5)VTE PROPH:  lovenox

## 2010-08-09 NOTE — Assessment & Plan Note (Signed)
Summary: per Dr. Scot Dock hfu [mkj]   Vital Signs:  Patient profile:   77 year old female Height:      60 inches (152.40 cm) Weight:      99.7 pounds (45.32 kg) BMI:     19.54 O2 Sat:      97 % Temp:     97.9 degrees F (36.61 degrees C) Pulse rate:   83 / minute BP sitting:   120 / 67  (left arm)  Vitals Entered By: Krystal Eaton Duncan Dull) (July 26, 2009 1:51 PM) CC: HFU-feels great Is Patient Diabetic? No Pain Assessment Patient in pain? no      Nutritional Status BMI of 19 -24 = normal  Does patient need assistance? Functional Status Self care Ambulation Wheelchair Comments arrived via motorized w/c   Primary Care Provider:  Yetta Barre MD  CC:  HFU-feels great.  History of Present Illness: This is a  year old woman with past medical history of   DJD Spina Bifida Pelvic mass Overactive bladder Neutropenia Lymphoma Herpes GERD Hyperlipidemia Hx of oral candidiasis in 01/07 on EGD Emphysema  She is here for follow up after hospitalization for CAP.  Since discharge she has been doing well.  She has no cough, fevers, chills or shortness of breath.  She has no complaints today.  Preventive Screening-Counseling & Management  Alcohol-Tobacco     Smoking Status: never     Passive Smoke Exposure: no  Current Medications (verified): 1)  Ditropan Xl 10 Mg  Tb24 (Oxybutynin Chloride) .... Take 1 Tablet By Mouth Once A Day 2)  Proair Hfa 108 (90 Base) Mcg/act  Aers (Albuterol Sulfate) .... Take  2 Puffs Every 4-6 Hours, As Needed. 3)  Symbicort 160-4.5 Mcg/act Aero (Budesonide-Formoterol Fumarate) .... Two Puffs Two Times A Day 4)  Ensure High Protein  Liqd (Nutritional Supplements) .... One Can Two Times A Day 5)  Ibuprofen 200 Mg Tabs (Ibuprofen) .... Two Times A Day For Next 2 Days. Then Take As Needed For Chest Pain  Allergies: 1)  ! Pcn 2)  ! Vicodin  Review of Systems       per hpi  Physical Exam  General:  alert and well-developed.   Mouth:   good dentition and pharynx pink and moist.   Lungs:  normal respiratory effort and normal breath sounds.  fine crackles right base. Heart:  normal rate, regular rhythm, and no murmur.   Extremities:  no edema Neurologic:  alert & oriented X3 and cranial nerves II-XII intact.   Psych:  Oriented X3, memory intact for recent and remote, normally interactive, and good eye contact.     Impression & Recommendations:  Problem # 1:  NEUTROPENIA NOS (ICD-288.00) will check CBC today, send to heme/onc  Orders: T-CBC w/Diff ([id number])  Problem # 2:  PNEUMONIA, COMMUNITY ACQUIRED, PNEUMOCOCCAL (ICD-481) Has reovered well.  Completed course of avelox and is now without symptoms.  No chest pain.  The following medications were removed from the medication list:    Avelox 400 Mg Tabs (Moxifloxacin hcl) ..... Once daily  Problem # 3:  PULMONARY NODULE, LEFT UPPER LOBE (ICD-518.89) Seen on CT of the chest 07/10/09.  May be PNA (for which she was admitted).  Needs a repeat CXR in a month (FEBRUARY OR MARCH 2011)  to re-evaluate.  Problem # 4:  LEG EDEMA, BILATERAL (ICD-782.3) none today.  Complete Medication List: 1)  Ditropan Xl 10 Mg Tb24 (Oxybutynin chloride) .... Take 1 tablet by mouth once a  day 2)  Proair Hfa 108 (90 Base) Mcg/act Aers (Albuterol sulfate) .... Take  2 puffs every 4-6 hours, as needed. 3)  Symbicort 160-4.5 Mcg/act Aero (Budesonide-formoterol fumarate) .... Two puffs two times a day 4)  Ensure High Protein Liqd (Nutritional supplements) .... One can two times a day 5)  Ibuprofen 200 Mg Tabs (Ibuprofen) .... Two times a day for next 2 days. then take as needed for chest pain  Patient Instructions: 1)  You had labwork today, we will call you if there is anything that needs to be addressed. 2)  Please schedule a follow-up appointment in 2 months. Prescriptions: ENSURE HIGH PROTEIN  LIQD (NUTRITIONAL SUPPLEMENTS) one can two times a day  #60 x 2   Entered and Authorized by:    Elby Showers MD   Signed by:   Elby Showers MD on 07/26/2009   Method used:   [facility] to ...       [id number]       [address]       [place], [place]  [address]       Ph: [phone] or [phone]       Fax: [phone]   RxID:   [id number] DITROPAN XL 10 MG  TB24 (OXYBUTYNIN CHLORIDE) Take 1 tablet by mouth once a day  #30 x 1   Entered and Authorized by:   Elby Showers MD   Signed by:   Elby Showers MD on 07/26/2009   Method used:   Telephoned to ...       [id number]       [address]       [place], [place]  [address]       Ph: [phone] or [phone]       Fax: [phone]   RxID:   [id number]   Prevention & Chronic Care Immunizations   Influenza vaccine: Fluvax 3+  (05/06/2008)    Tetanus booster: Not documented    Pneumococcal vaccine: Not documented    H. zoster vaccine: Not documented  Colorectal Screening   Hemoccult: Not documented    Colonoscopy: Normal  (02/19/2005)  Other Screening   Pap smear: Not documented    Mammogram: Not documented    DXA bone density scan: Not documented   Smoking status: never  (07/26/2009)  Lipids   Total Cholesterol: Not documented   LDL: Not documented   LDL Direct: Not documented   HDL: Not documented   Triglycerides: Not documented    SGOT (AST): 20  (05/04/2007)   SGPT (ALT): 18  (05/04/2007)   Alkaline phosphatase: 103  (05/04/2007)   Total bilirubin: 0.7  (05/04/2007)  Self-Management Support :    Patient will work on the following items until the next clinic visit to reach self-care goals:     Eating: eat more vegetables  (07/26/2009)    Lipid self-management support: Not documented   Process Orders Check Orders Results:     [facility]: Check successful Tests Sent for requisitioning (July 26, 2009 3:04 PM):     07/26/2009: [facility]  w/Diff [[id number]] (signed)

## 2010-09-21 LAB — POCT I-STAT, CHEM 8
BUN: 17 mg/dL (ref 6–23)
Calcium, Ion: 1.15 mmol/L (ref 1.12–1.32)
Creatinine, Ser: 0.7 mg/dL (ref 0.4–1.2)
Glucose, Bld: 100 mg/dL — ABNORMAL HIGH (ref 70–99)
HCT: 39 % (ref 36.0–46.0)
Hemoglobin: 13.3 g/dL (ref 12.0–15.0)
Potassium: 3.6 mEq/L (ref 3.5–5.1)
TCO2: 26 mmol/L (ref 0–100)

## 2010-09-21 LAB — SEDIMENTATION RATE: Sed Rate: 15 mm/hr (ref 0–22)

## 2010-09-22 LAB — CBC
HCT: 29.1 % — ABNORMAL LOW (ref 36.0–46.0)
HCT: 29.4 % — ABNORMAL LOW (ref 36.0–46.0)
Hemoglobin: 10 g/dL — ABNORMAL LOW (ref 12.0–15.0)
MCHC: 34 g/dL (ref 30.0–36.0)
MCHC: 34.2 g/dL (ref 30.0–36.0)
MCHC: 34.2 g/dL (ref 30.0–36.0)
MCHC: 34.3 g/dL (ref 30.0–36.0)
MCV: 91.6 fL (ref 78.0–100.0)
Platelets: 126 10*3/uL — ABNORMAL LOW (ref 150–400)
Platelets: 139 10*3/uL — ABNORMAL LOW (ref 150–400)
Platelets: 158 10*3/uL (ref 150–400)
RBC: 3.19 MIL/uL — ABNORMAL LOW (ref 3.87–5.11)
RBC: 3.32 MIL/uL — ABNORMAL LOW (ref 3.87–5.11)
RBC: 4.05 MIL/uL (ref 3.87–5.11)
RDW: 12.9 % (ref 11.5–15.5)
RDW: 13.1 % (ref 11.5–15.5)
RDW: 13.2 % (ref 11.5–15.5)

## 2010-09-22 LAB — POCT CARDIAC MARKERS
CKMB, poc: 1.4 ng/mL (ref 1.0–8.0)
Myoglobin, poc: 69.1 ng/mL (ref 12–200)
Myoglobin, poc: 83.9 ng/mL (ref 12–200)

## 2010-09-22 LAB — BASIC METABOLIC PANEL
BUN: 5 mg/dL — ABNORMAL LOW (ref 6–23)
BUN: 6 mg/dL (ref 6–23)
CO2: 24 mEq/L (ref 19–32)
CO2: 25 mEq/L (ref 19–32)
CO2: 26 mEq/L (ref 19–32)
Calcium: 7.9 mg/dL — ABNORMAL LOW (ref 8.4–10.5)
Calcium: 8.3 mg/dL — ABNORMAL LOW (ref 8.4–10.5)
Calcium: 9 mg/dL (ref 8.4–10.5)
Chloride: 104 mEq/L (ref 96–112)
Creatinine, Ser: 0.56 mg/dL (ref 0.4–1.2)
GFR calc Af Amer: >60 mL/min (ref >60)
GFR calc Af Amer: >60 mL/min (ref >60)
GFR calc non Af Amer: >60 mL/min (ref >60)
GFR calc non Af Amer: >60 mL/min (ref >60)
GFR calc non Af Amer: >60 mL/min (ref >60)
GFR calc non Af Amer: >60 mL/min (ref >60)
Glucose, Bld: 109 mg/dL — ABNORMAL HIGH (ref 70–99)
Glucose, Bld: 113 mg/dL — ABNORMAL HIGH (ref 70–99)
Potassium: 3.4 mEq/L — ABNORMAL LOW (ref 3.5–5.1)
Potassium: 3.7 mEq/L (ref 3.5–5.1)
Potassium: 3.9 mEq/L (ref 3.5–5.1)
Sodium: 134 mEq/L — ABNORMAL LOW (ref 135–145)
Sodium: 135 mEq/L (ref 135–145)
Sodium: 136 mEq/L (ref 135–145)
Sodium: 136 mEq/L (ref 135–145)

## 2010-09-22 LAB — URINALYSIS, ROUTINE W REFLEX MICROSCOPIC
Hgb urine dipstick: NEGATIVE
Ketones, ur: NEGATIVE mg/dL
Leukocytes, UA: NEGATIVE
Urobilinogen, UA: 1 mg/dL (ref 0.0–1.0)

## 2010-09-22 LAB — DIFFERENTIAL
Basophils Absolute: 0 10*3/uL (ref 0.0–0.1)
Basophils Absolute: 0 10*3/uL (ref 0.0–0.1)
Basophils Absolute: 0 10*3/uL (ref 0.0–0.1)
Basophils Relative: 0 % (ref 0–1)
Basophils Relative: 0 % (ref 0–1)
Eosinophils Absolute: 0.1 10*3/uL (ref 0.0–0.7)
Eosinophils Relative: 5 % (ref 0–5)
Lymphocytes Relative: 10 % — ABNORMAL LOW (ref 12–46)
Lymphocytes Relative: 7 % — ABNORMAL LOW (ref 12–46)
Lymphs Abs: 0.4 10*3/uL — ABNORMAL LOW (ref 0.7–4.0)
Monocytes Absolute: 0.3 10*3/uL (ref 0.1–1.0)
Monocytes Absolute: 0.3 10*3/uL (ref 0.1–1.0)
Monocytes Relative: 6 % (ref 3–12)
Monocytes Relative: 8 % (ref 3–12)
Neutro Abs: 3 10*3/uL (ref 1.7–7.7)
Neutro Abs: 4.9 10*3/uL (ref 1.7–7.7)
Neutro Abs: 6.5 10*3/uL (ref 1.7–7.7)
Neutrophils Relative %: 87 % — ABNORMAL HIGH (ref 43–77)

## 2010-09-22 LAB — TROPONIN I: Troponin I: 0.03 ng/mL (ref 0.00–0.06)

## 2010-09-22 LAB — CARDIAC PANEL(CRET KIN+CKTOT+MB+TROPI)
CK, MB: 1.6 ng/mL (ref 0.3–4.0)
Total CK: 77 U/L (ref 7–177)
Troponin I: 0.04 ng/mL (ref 0.00–0.06)

## 2010-09-22 LAB — IRON AND TIBC: Iron: <10 ug/dL — ABNORMAL LOW (ref 42–135)

## 2010-09-22 LAB — RETICULOCYTES: Retic Count, Absolute: 17 10*3/uL — ABNORMAL LOW (ref 19.0–186.0)

## 2010-09-22 LAB — HEMOGLOBIN A1C: Mean Plasma Glucose: 97 mg/dL

## 2010-09-28 ENCOUNTER — Encounter: Payer: Self-pay | Admitting: Internal Medicine

## 2010-09-28 DIAGNOSIS — Z8619 Personal history of other infectious and parasitic diseases: Secondary | ICD-10-CM | POA: Insufficient documentation

## 2010-10-14 LAB — COMPREHENSIVE METABOLIC PANEL
ALT: 36 U/L — ABNORMAL HIGH (ref 0–35)
Albumin: 3.5 g/dL (ref 3.5–5.2)
Calcium: 9.4 mg/dL (ref 8.4–10.5)
GFR calc Af Amer: >60 mL/min (ref >60)
Glucose, Bld: 94 mg/dL (ref 70–99)
Sodium: 139 mEq/L (ref 135–145)
Total Protein: 7.2 g/dL (ref 6.0–8.3)

## 2010-10-14 LAB — CBC
HCT: 36.9 % (ref 36.0–46.0)
Hemoglobin: 12.8 g/dL (ref 12.0–15.0)
MCHC: 34.6 g/dL (ref 30.0–36.0)
Platelets: 175 10*3/uL (ref 150–400)
RDW: 12.5 % (ref 11.5–15.5)

## 2010-10-14 LAB — POCT CARDIAC MARKERS
Myoglobin, poc: 114 ng/mL (ref 12–200)
Troponin i, poc: <0.05 ng/mL (ref 0.00–0.09)
Troponin i, poc: <0.05 ng/mL (ref 0.00–0.09)

## 2010-10-14 LAB — DIFFERENTIAL
Basophils Relative: 0 % (ref 0–1)
Eosinophils Relative: 16 % — ABNORMAL HIGH (ref 0–5)
Monocytes Absolute: 0.2 10*3/uL (ref 0.1–1.0)
Neutrophils Relative %: 24 % — ABNORMAL LOW (ref 43–77)

## 2010-11-23 NOTE — Consult Note (Signed)
NAMEDEBANY, VANTOL NO.:  [id number]   MEDICAL RECORD NO.:  [id number]          PATIENT TYPE:  INP   LOCATION:  5013                         FACILITY:  [facility]   PHYSICIAN:  Bernette Redbird, M.D.   DATE OF BIRTH:  08-31-1930   DATE OF CONSULTATION:  07/02/2004  DATE OF DISCHARGE:                                   CONSULTATION   REASON FOR CONSULTATION:  The physician assistant covering for Dr. Beverely Low asked me to see this 77 year old female because of postoperative  ileus.   Ms. Trefz is known to me from approximately four years ago when she was  having problems with altered bowel habit, including diarrhea, and underwent  colonoscopy (it showed severe left-sided diverticulosis and some fixation of  the sigmoid colon), and upper endoscopy (normal except for esophageal ring).  Random biopsies were unrevealing.   With that background, the patient underwent right arthroscopic knee surgery  three days ago.  Since that time, she has eaten essentially nothing and has  had progressive abdominal distention and discomfort which more or less came  to a head today.  She has some upper abdominal discomfort, but no severe  pain.  Trials of ____________ tablets, a Fleet's enema, and citrate of  magnesia have not lead to any significant stool output, but instead, the  patient has vomited x1, a small to moderate amount of what sounds like  brown, but not coffee-ground material.  Interestingly, the patient  apparently had somewhat similar problems after some previous shoulder  surgery, at which time she had actually been discharged home after several  days, but had come back with locked bowels.   A KUB was obtained today which shows a colonic ileus pattern without frank  small bowel obstruction or significant small intestinal ileus.   ALLERGIES:  PENICILLIN (rash and itching).   OUTPATIENT MEDICATIONS:  Ditropan.   PAST SURGICAL HISTORY:  1.  Appendectomy.  2.   Hysterectomy.  3.  Multiple leg operations for problems related to spina bifida.   PAST MEDICAL HISTORY:  1.  Hyperlipidemia.  2.  Neutropenia.   FAMILY HISTORY:  Was not obtained by me, but apparently was positive for  cancer and coronary artery disease.   SOCIAL HISTORY:  The patient had been living at home prior to this, but is  confined to a wheelchair on account of her lower extremity weakness.   REVIEW OF SYSTEMS:  Prior to admission, the patient's main GI symptom was  occasional constipation with minimal hematochezia, mostly on the toilet  paper, but some times in the commode.  No problem with loss of appetite or  loss of weight.   PHYSICAL EXAMINATION:  GENERAL:  Ms. Crisanti is mildly confused, but certainly  in no distress, and for the most part talks coherently.  She does repeat  herself some of the time.  HEENT:  She is anicteric and without frank pallor.  CHEST:  Clear.  HEART:  Normal.  ABDOMEN:  Does have active, non-obstructive bowel sounds, and some tympany  with diffuse tenderness, slightly tense belly, but not  really rigid or firm,  no severe guarding, no mass effect or tenderness.   LABORATORY DATA:  Pending at this time.   IMPRESSION:  KUB.  I have looked at the KUB.  There is relatively little gas  in the rectosigmoid, but a fair amount of gas in the ascending colon and  transverse colon, and a small amount in the descending colon.  There is also  a small amount of small bowel gas.  This does not look like an obstructive  pattern to me.  I have reviewed it with the radiologist, Dr. Karin Golden, who  confirms.  There is a small to moderate amount of stool in the proximal  colon.   DISCUSSION AND PLAN:  I think that the overall picture is most consistent  with a postoperative colonic ileus, exacerbated by previous PCA narcotic  analgesia (most recently used yesterday), and today she has been on Percocet  for a couple of doses.  On top of this, the patient's lack  of mobility due  to her spina bifida may be contributing to the problem.   PLAN:  1.  Intravenous Protonix and intravenous Reglan have already been ordered by      the orthopaedic service and I feel these are appropriate.  2.  I will add high dose Zelnorm (6 mg t.i.d.) with the hope of further      improvement in the colonic motility.  3.  It does not appear that this is a problem with fecal obstipation or      impaction, so I do not feel that further laxatives or enemas are likely      to be helpful.  Similarly, since her gas is more proximal, I do not      think a rectal tube would likely be of benefit.  4.  I think it is okay for the patient to take non-carbonated clear liquids      ad lib.  5.  I will order some maintenance intravenous fluids to make sure that the      patient does not become dehydrated until such time that she has reliable      p.o. intake.  6.  We will check blood work, looking for evidence of hypokalemia or other      acute problems which may be related to her symptoms.   I appreciate the opportunity to have seen this patient in consultation with  you.       RB/MEDQ  D:  07/02/2004  T:  07/03/2004  Job:  [id number]   cc:   Almedia Balls. Ranell Patrick, M.D.  [facility]  [address]  [address]  [place]  [place] [address]  Fax: [phone]   Valentino Hue. Magrinat, M.D.  [address] [facility]  [address] [place]  [place] [address]  Fax: [phone]

## 2010-11-23 NOTE — Procedures (Signed)
[facility]  Patient:    Jill Flowers, Jill Flowers                MRN: [id number] Proc. Date: 02/25/00 Adm. Date:  [id number] Attending:  Rich Brave CC:         Valentino Hue. Magrinat, M.D.   Procedure Report  PROCEDURE PERFORMED:  Colonoscopy with biopsies.  ENDOSCOPIST:  Florencia Reasons, M.D.  INDICATIONS:  A 77 year old with recent change in bowel habits and lower abdominal pain.  FINDINGS:  Extensive left-sided diverticulosis with fixation of the colon. Otherwise normal exam to the cecum.  DESCRIPTION OF PROCEDURE:  The nature, purpose and risk of the procedure have been with the patient who provided written consent.  Sedation was fentanyl 37.5 mcg and Versed 5 mg IV, running a low heart rate in the upper 40s and a low blood pressure transiently down in the systolic range of 87 or so, but no overt frank clinical instability.  Specifically, the patient was arouseable throughout the procedure.  Digital exam was unremarkable.  The Olympus pediatric extended length video colonoscope PCF-140L was inserted and advanced with considerable difficulty through a fixated, angulated sigmoid region with a great deal of diverticular disease, and then around the colon with some tendency for looping.  We put the patient in the supine position and ultimately, the right lateral decubitus position and we were successful in reaching the base of the cecum after which pull back was initiated.  It was not really possible to enter the terminal ileum.  The quality of the prep was fairly good and it is felt that essentially all areas were adequately seen and that no significant lesions would have been missed.  There was extensive left-sided diverticulosis, but no colon polyps, masses, vascular malformations or colitis were observed.  Retroflexion could not be performed in the rectum due to the small rectal ampulla, but antegrade viewing disclosed no distal  rectal lesions.  Random mucosal biopsies were obtained because of the history of intermittent diarrhea and abdominal pain.  The patient tolerated the procedure quite well with some mild abnormality of parameters as noted above.  PLAN: 1. Await pathology on biopsies. 2. Follow up per Dr. Darnelle Catalan. DD:  02/25/00 TD:  02/26/00 Job: [id number]

## 2010-11-23 NOTE — Discharge Summary (Signed)
Jill Flowers, Jill Flowers NO.:  [id number]   MEDICAL RECORD NO.:  [id number]          PATIENT TYPE:  INP   LOCATION:  6727                         FACILITY:  [facility]   PHYSICIAN:  Isidor Holts, M.D.  DATE OF BIRTH:  12/15/1930   DATE OF ADMISSION:  08/17/2006  DATE OF DISCHARGE:  08/24/2006                               DISCHARGE SUMMARY   ADDENDUM   For discharge diagnoses, procedures, discharge medications,  consultations, admission history and detailed clinical course, refer to  interim summary dated August 21, 2006 by Dr. Isidor Holts.  In  addition, for the subsequent period from August 22, 2006 to August 24, 2006, there were no new issues.  The patient's sore throat has  resolved, although she is expected to complete her course of Diflucan on  August 26, 2006.  There is no clinical evidence of pneumonia.  Her  white cell count has normalized, and as a matter of fact it is 3.4 on  August 24, 2006.  No pyrexia has been recorded.  The patient is no  longer coughing and feels quite well.  She was evaluated by physical  therapy/occupational therapy on August 22, 2006.  Home PT/OT is  recommended; however, the family have declined this and have undertaken  to assist the patient with her needs, although it has been explained to  them that the insurance will cover PT/OT & HHRN, although would not  cover a home health aid.  There are no other acute issues, and the  patient is sufficiently clinically recovered.  She was discharged on  August 24, 2006.      Isidor Holts, M.D.  Electronically Signed     CO/MEDQ  D:  08/24/2006  T:  08/24/2006  Job:  [id number]

## 2010-11-23 NOTE — H&P (Signed)
NAMEANGELISA, Jill Flowers                 ACCOUNT NO.:  [id number]   MEDICAL RECORD NO.:  [id number]          PATIENT TYPE:  INP   LOCATION:  6727                         FACILITY:  [facility]   PHYSICIAN:  Jill I Elsaid, MD      DATE OF BIRTH:  [birth year]   DATE OF ADMISSION:  08/17/2006  DATE OF DISCHARGE:                              HISTORY & PHYSICAL   PRIMARY CARE PHYSICIAN:  Unassigned.   CHIEF COMPLAINT:  Cough and fever for 4 days.   HISTORY OF PRESENT ILLNESS:  A 77 year old female with a history of  gastroesophageal reflux disease and spina bifida, history of low-grade  lymphoma and neutropenia.  According to her, she used to get some shot  with each infection .  Admitted today through [facility] ER with a  history of fever and cough that started since Friday.  The cough was dry  associated with mild shortness of breath and associated with low grade  fever and chills.  The patient had a visit to her primary care on  Friday, where she was prescribed some antibiotic, but the fever  continued to get worse and the shortness of breath getting worse.  The  cough was characterized as a croupy cough with dyspnea and there is  myalgia and nasal congestion, nonproductive cough and is not associated  with sore throat.  The patient denies any aggravating or relieving  factors.  The condition is associated with generalized malaise and  weakness.  The patient reports temperature of 101 at home.   PAST MEDICAL HISTORY:  1. Spina bifida.  2. Gastroesophageal reflux disease.  3. Neutropenia/low-grade lymphoma.  4. Hyperlipidemia.  5. History of rotator cuff tear, status post repair bilateral.  6. History of back surgery.  7. History of C-section.  8. History of hysterectomy.  9. History of gait instability.  The patient is in wheelchair.   ALLERGIES:  MORPHINE AND PENICILLIN.   SOCIAL HISTORY:  Lives with her husband.  No history of smoking.  No  history of drinking.   FAMILY HISTORY:   Positive for history of cancer of the throat and  history of heart attack.   EXAMINATION:  VITALS SIGNS:  In the ER blood pressure 118/64, pulse rate  89, respiratory rate 18, temperature 99.2.  HEENT:  She is anicteric and extraocular movement was normal.  CHEST:  Bilateral crackles, mainly at the bilateral lower zones.  HEART:  S1 and S2.  No added sounds.  ABDOMEN:  Soft, nontender.  Bowel sounds positive.  No organomegaly.  EXTREMITIES:  There is no edema.  CNS:  The patient alert and oriented x3.  She can move her upper  extremity, but there is limitations on the lower extremity, which is  chronic.  The power around 3/5 on the lower extremity due to her spina  bifida and she is wheelchair.   Chest x-ray:  Right lower lobe pneumonia.   LAB RESULTS:  Cardiac markers negative.  CBC:  White blood cells 4.6,  hemoglobin 12, hematocrit 35.4, and platelets 247.  CMP 138, potassium  3.2, chloride 105,  CO2 24.  BUN 14 and creatinine 0.49.  EKG normal  sinus rhythm, normal axis, normal __________.   ASSESSMENT AND PLAN:  1. Community-acquired pneumonia on the right lower lobe.  Will start      the patient on Zithromax and Rocephin pending a sputum culture and      blood culture.  Will repeat followup x-ray in 1 or 2 days.  2. History of neutropenia in C3, 4, and 7.  Family admitted that the      patient is seen by Dr. Darnelle Catalan, oncologist, during each      hospitalization with fever.  Will try to contact Dr. Darnelle Catalan,      although the patient's ANC is normal.  3. History of rotator cuff tear, stable.  4. History of spina bifida, stable.   CURRENT MEDICATIONS:  The patient does not know her current medication.  Discussed with the family that they have to bring the medication  tomorrow.  DVT and GI prophylaxis.      Jill Bosie Helper, MD  Electronically Signed     HIE/MEDQ  D:  08/17/2006  T:  08/18/2006  Job:  [id number]

## 2010-11-23 NOTE — Op Note (Signed)
Jill Flowers, Jill Flowers NO.:  [id number]   MEDICAL RECORD NO.:  [id number]          PATIENT TYPE:  INP   LOCATION:  5013                         FACILITY:  [facility]   PHYSICIAN:  Almedia Balls. Ranell Patrick, M.D. DATE OF BIRTH:  1930/12/15   DATE OF PROCEDURE:  06/29/2004  DATE OF DISCHARGE:                                 OPERATIVE REPORT   PREOPERATIVE DIAGNOSIS:  Right shoulder rotator cuff tear and  acromioclavicular joint arthritis.   POSTOPERATIVE DIAGNOSIS:  Right shoulder rotator cuff tear and  acromioclavicular joint arthritis.  Partial thickness subscapularis tear as  well as superior labral tear anterior posterior with unstable biceps anchor  and degenerative joint disease in the glenohumeral joint.   OPERATION PERFORMED:  Right shoulder arthroscopy with extensive intra-  articular debridement including debridement of a SLAP lesion with unstable  biceps anchor with arthroscopic biceps tenotomy followed by arthroscopic  subacromial decompression and mini open rotator cuff repair, open biceps  tenodesis in the groove and open distal clavicle excision.  We also did a  debridement of partial thickness subscapularis tear.   SURGEON:  Almedia Balls. Ranell Patrick, M.D.   ASSISTANT:  Donnie Coffin. Durwin Nora, P.A.   ANESTHESIA:  General.   ESTIMATED BLOOD LOSS:  Minimal.   FLUIDS REPLACED:  1000 mL crystalloid.   INSTRUMENT COUNT:  Correct.   COMPLICATIONS:  None.   ANTIBIOTICS:  Perioperative antibiotics given.   INDICATIONS FOR PROCEDURE:  The patient is a 77 year old female who presents  complaining of right shoulder pain.  She has had prior left shoulder surgery  in the past, had done well from that, presents complaining of similar  symptoms, right shoulder.  Patient is limited because of lower extremity  paralysis and requires significant use of her upper extremities and this is  inhibiting her ability to stay independent.  After counseling the patient  regarding options for  management to include conservative management versus  surgical treatment, the patient elected to proceed with surgery.  Informed  consent obtained.   DESCRIPTION OF PROCEDURE:  After an adequate level of anesthesia was  achieved, the patient was positioned in modified beach positioner, right  shoulder  examination under anesthesia.  No stiffness was noted in the arm  with forward elevation to 170 degrees abduction, 120 degrees of external  rotation, 80 internal rotation while behind her back, negative sulcus,  negative anterior posterior drawer.  After completion of the examination  under anesthesia, the right shoulder was sterilely prepped and draped in the  usual manner.  Diagnostic arthroscopy was carried out in the standard  arthroscopic portals.  Anterior, posterior and lateral created in similar  fashion.  Infiltration of  skin with 0.25% Marcaine with epinephrine  followed by incision with 11 blade scalpel and introduction of cannula to  the joint __________ diagnostic arthroscopy revealed extensive tearing of  the biceps tendon and superior labrum including unstable biceps anchor.  This was debrided back to stable labral tissue including an arthroscopic  biceps tenotomy.  There was also noted to be a full thickness supraspinatus  tear and a partial thickness  subscapularis tear. Both these tears were  debrided back to stable tendinous tissue.  Anterior inferior labrum intact.  The glenohumeral  articular cartilage with grade 3 chondromalacia.  Posterior labrum had a torn segment and a loose piece of calcified labrum  which was removed sharply using a knife and pituitary rongeur.  Remainder of  the labrum smoothed out using a full radius resector.  The scope was placed  in the subacromial space.  Thorough bursectomy was performed followed by a  preservation of the CA ligament.  We did do a little bit of minimal  debridement using the full radius on the acromial surface to smooth it  out  but basically the shape was acceptable.  The rotator cuff tear was  identified from the bursal surface as well.  At this point arthroscopy was  concluded.  We made a small saber incision over the distal clavicle in line  with Langer's skin lines.  Dissection was carried sharply down to the  subcutaneous tissues.  The deltotrapezial fascia was split in line with its  fibers and subperiosteal dissection of the distal clavicle was performed  followed by excision of the distal 1 cm using an oscillating saw.  Thorough  irrigation of the AC interval followed by application of bone wax and  cutting of the clavicle and then repair of the deltotrapezial fascia using  interrupted 0 Vicryl with buried knots.  Attention was directed now towards  the rotator cuff repair and the biceps tenodesis.  We made a small 3 cm  incision in Langer's skin lines overlying the raphe between the  anterolateral heads of the deltoid.  Dissection carried sharply down through  subcutaneous tissues.  Deltoid identified and raphe between the two heads  identified. This was developed using needle tip Bovie.  A small self-  retaining retractor was placed in exposing the rotator cuff tear and also  the bicipital groove.  We incised the soft tissue overlying the bicipital  groove curetted the bottom floor of the groove and tenodesed the biceps in  midtension with the elbow at 90 degrees using a 3.0 Arthrex bioabsorbable  anchor.  This was preloaded with FiberWire and had nice low profile  tenodesis removing the remaining portion of the biceps and then closing the  soft tissue over the top of that.  We then approached the rotator cuff tear.  This appeared to be repairable, anterior to posterior as well as single  anchor.  We roughened up the rotator cuff foot print and then placed two  FiberWire sutures again from anterior to posterior to close the rotator cuff defect and as well placed a single 5-0 corkscrew anchor at the  articular  margin.  We placed two horizontal mattresses out through the anterior and  posterior flaps of the rotator cuff.  This gave [place] nice side-to-side repair  of the rotator cuff as well as bringing the cuff down to the footprint. A  low profile repair was obtained with full range of motion __________  passively on the table after we did the repair and no chafing underneath the  acromion.  We irrigated thoroughly. We then closed the deltoid using  interrupted 0 Vicryl sutures simple with buried knots, 2-0 Vicryl  subcutaneous closure on both wounds and then a running 4-0 Monocryl for skin  and portals.  Steri-Strips applied followed by sterile dressing and shoulder  immobilizer.  The patient was then transferred to the recovery room in  stable condition.  SRN/MEDQ  D:  06/29/2004  T:  07/02/2004  Job:  [id number]

## 2010-11-26 ENCOUNTER — Encounter (HOSPITAL_BASED_OUTPATIENT_CLINIC_OR_DEPARTMENT_OTHER): Payer: Medicare Other | Admitting: Oncology

## 2010-11-26 ENCOUNTER — Other Ambulatory Visit: Payer: Self-pay | Admitting: Oncology

## 2010-11-26 DIAGNOSIS — Z1231 Encounter for screening mammogram for malignant neoplasm of breast: Secondary | ICD-10-CM

## 2010-11-26 DIAGNOSIS — D709 Neutropenia, unspecified: Secondary | ICD-10-CM

## 2010-11-26 LAB — CBC WITH DIFFERENTIAL/PLATELET
BASO%: 1.8 % (ref 0.0–2.0)
EOS%: 7 % (ref 0.0–7.0)
HCT: 37.4 % (ref 34.8–46.6)
LYMPH%: 54.4 % — ABNORMAL HIGH (ref 14.0–49.7)
MCH: 30.5 pg (ref 25.1–34.0)
MCHC: 33.4 g/dL (ref 31.5–36.0)
MONO%: 19.3 % — ABNORMAL HIGH (ref 0.0–14.0)
NEUT%: 17.5 % — ABNORMAL LOW (ref 38.4–76.8)
Platelets: 158 10*3/uL (ref 145–400)

## 2010-12-12 ENCOUNTER — Ambulatory Visit: Payer: Medicare Other

## 2011-04-03 LAB — POCT I-STAT, CHEM 8
BUN: 21
Chloride: 107
Glucose, Bld: 86
HCT: 36
Potassium: 4.3

## 2011-04-03 LAB — POCT CARDIAC MARKERS
CKMB, poc: <1 — ABNORMAL LOW
Troponin i, poc: <0.05

## 2011-04-03 LAB — DIFFERENTIAL
Basophils Absolute: 0
Basophils Relative: 1
Eosinophils Absolute: 0.7
Eosinophils Relative: 31 — ABNORMAL HIGH
Lymphocytes Relative: 28
Lymphs Abs: 0.7
Monocytes Absolute: 0.3
Monocytes Relative: 11
Neutro Abs: 0.7 — ABNORMAL LOW
Neutrophils Relative %: 29 — ABNORMAL LOW

## 2011-04-03 LAB — CBC
Hemoglobin: 12.1
MCHC: 34.2
RDW: 12.3

## 2011-04-03 LAB — INFLUENZA A+B VIRUS AG-DIRECT(RAPID): Influenza B Ag: NEGATIVE

## 2011-04-05 LAB — DIFFERENTIAL
Basophils Relative: 1
Lymphocytes Relative: 33
Monocytes Relative: 42 — ABNORMAL HIGH
Neutro Abs: 0.2 — ABNORMAL LOW

## 2011-04-05 LAB — CBC
HCT: 36
MCV: 91.1
Platelets: 146 — ABNORMAL LOW
RDW: 12.1

## 2011-04-05 LAB — COMPREHENSIVE METABOLIC PANEL
Albumin: 3.5
BUN: 13
Creatinine, Ser: 0.63
Potassium: 3.5
Total Protein: 7.2

## 2011-04-08 LAB — BASIC METABOLIC PANEL
CO2: 26
Chloride: 105
GFR calc Af Amer: >60
Sodium: 138

## 2011-04-08 LAB — POCT CARDIAC MARKERS: Troponin i, poc: <0.05

## 2011-04-08 LAB — CBC
Hemoglobin: 12.9
MCHC: 34.1
MCV: 91.4
RBC: 4.14

## 2011-04-08 LAB — DIFFERENTIAL
Basophils Relative: 0
Eosinophils Absolute: 0.7
Monocytes Absolute: 0.4
Monocytes Relative: 14 — ABNORMAL HIGH
Neutro Abs: 1 — ABNORMAL LOW

## 2011-11-20 ENCOUNTER — Telehealth: Payer: Self-pay | Admitting: *Deleted

## 2011-11-20 NOTE — Telephone Encounter (Signed)
main number was disconnected on 11-20-2011

## 2011-12-31 ENCOUNTER — Ambulatory Visit: Payer: Medicare Other | Admitting: Oncology

## 2011-12-31 ENCOUNTER — Other Ambulatory Visit: Payer: Medicare Other

## 2012-04-03 ENCOUNTER — Ambulatory Visit: Payer: Medicare Other

## 2012-04-03 ENCOUNTER — Ambulatory Visit (INDEPENDENT_AMBULATORY_CARE_PROVIDER_SITE_OTHER): Payer: Medicare Other | Admitting: Family Medicine

## 2012-04-03 VITALS — BP 134/74 | HR 95 | Temp 98.6°F | Resp 18

## 2012-04-03 DIAGNOSIS — D72819 Decreased white blood cell count, unspecified: Secondary | ICD-10-CM

## 2012-04-03 DIAGNOSIS — R079 Chest pain, unspecified: Secondary | ICD-10-CM

## 2012-04-03 LAB — POCT CBC
Granulocyte percent: 31.7 %G — AB (ref 37–80)
Lymph, poc: 0.8 (ref 0.6–3.4)
MCH, POC: 30 pg (ref 27–31.2)
MCHC: 31 g/dL — AB (ref 31.8–35.4)
MCV: 96.6 fL (ref 80–97)
MID (cbc): 0.3 (ref 0–0.9)
POC LYMPH PERCENT: 51.8 %L — AB (ref 10–50)
Platelet Count, POC: 171 10*3/uL (ref 142–424)
RDW, POC: 13.3 %

## 2012-04-03 NOTE — Patient Instructions (Signed)
If further episodes of chest pain get reevaluated.  Take the blood count and see the blood/cancer doctor back again.

## 2012-04-03 NOTE — Progress Notes (Signed)
Subjective: 78 year old lady who was laying in bed this morning and was awakened by couple of sharp pains in her chest.  No history of heart disease. Not on a regular medications. No shortness of breath nausea or for all of the pain. The pain is in the right anterior chest. It sounds like it is fairly fleeting. It did scare her and she called her daughter. The granddaughter came over.  Yesterday was a normal does day. She sat around and watched television.  She working to she is in her 57s. She is retired. Her husband died earlier this year. She does not drive. She apparently has mild cerebral palsy and uses a walker. She no longer has a regular doctor. Used to see Dr. [place]. Also used see somebody who is following her blood because of a concern about a cancer, but it was never confirmed. She's done well.  She's not having any pain at this time.  Objective: Pleasant lady in no acute distress. Her TMs are normal. Throat was clear. Neck supple without nodes or thyromegaly. No carotid bruits. Chest is clear to auscultation. Heart regular without murmurs gallops or arrhythmias. Abdomen soft without mass tenderness. No CVA tenderness. No ankle edema.  Assessment: Right chest pain  Plan: EKG was normal Chest x-ray [facility] reading (PRIMARY) by  Dr. Alwyn Ren Normal chest CBC  Results for orders placed in visit on 04/03/12  POCT CBC      Component Value Range   WBC 1.6 (*) 4.6 - 10.2 K/uL   Lymph, poc 0.8  0.6 - 3.4   POC LYMPH PERCENT 51.8 (*) 10 - 50 %L   MID (cbc) 0.3  0 - 0.9   POC MID % 16.5 (*) 0 - 12 %M   POC Granulocyte 0.5 (*) 2 - 6.9   Granulocyte percent 31.7 (*) 37 - 80 %G   RBC 4.64  4.04 - 5.48 M/uL   Hemoglobin 13.9  12.2 - 16.2 g/dL   HCT, POC 16.1  09.6 - 47.9 %   MCV 96.6  80 - 97 fL   MCH, POC 30.0  27 - 31.2 pg   MCHC 31.0 (*) 31.8 - 35.4 g/dL   RDW, POC 04.5     Platelet Count, POC 171  142 - 424 K/uL   MPV 9.9  0 - 99.8 fL     Epic reviewed.  Apparently a question  of a lymphoma, but since it apparently never developed I told her to follow with her oncologist.  ? Myelodysplasia.

## 2012-06-07 DIAGNOSIS — Z0271 Encounter for disability determination: Secondary | ICD-10-CM

## 2012-10-11 ENCOUNTER — Encounter: Payer: Self-pay | Admitting: Oncology

## 2014-06-17 ENCOUNTER — Other Ambulatory Visit: Payer: Self-pay | Admitting: Family Medicine

## 2014-06-17 DIAGNOSIS — F0390 Unspecified dementia without behavioral disturbance: Secondary | ICD-10-CM

## 2014-06-17 DIAGNOSIS — R413 Other amnesia: Secondary | ICD-10-CM

## 2014-06-27 ENCOUNTER — Other Ambulatory Visit: Payer: Medicare Other

## 2016-01-04 ENCOUNTER — Observation Stay (HOSPITAL_COMMUNITY)
Admission: EM | Admit: 2016-01-04 | Discharge: 2016-01-08 | Disposition: A | Discharge status: Home / Self Care | Payer: Medicare Other | Attending: Internal Medicine | Admitting: Internal Medicine

## 2016-01-04 ENCOUNTER — Emergency Department (HOSPITAL_COMMUNITY): Payer: Medicare Other

## 2016-01-04 ENCOUNTER — Encounter (HOSPITAL_COMMUNITY): Payer: Self-pay

## 2016-01-04 DIAGNOSIS — E785 Hyperlipidemia, unspecified: Secondary | ICD-10-CM | POA: Insufficient documentation

## 2016-01-04 DIAGNOSIS — Z880 Allergy status to penicillin: Secondary | ICD-10-CM | POA: Diagnosis not present

## 2016-01-04 DIAGNOSIS — M79672 Pain in left foot: Secondary | ICD-10-CM

## 2016-01-04 DIAGNOSIS — J410 Simple chronic bronchitis: Secondary | ICD-10-CM | POA: Diagnosis not present

## 2016-01-04 DIAGNOSIS — M109 Gout, unspecified: Secondary | ICD-10-CM | POA: Insufficient documentation

## 2016-01-04 DIAGNOSIS — Z23 Encounter for immunization: Secondary | ICD-10-CM | POA: Insufficient documentation

## 2016-01-04 DIAGNOSIS — L8990 Pressure ulcer of unspecified site, unspecified stage: Secondary | ICD-10-CM | POA: Diagnosis not present

## 2016-01-04 DIAGNOSIS — Z8572 Personal history of non-Hodgkin lymphomas: Secondary | ICD-10-CM | POA: Diagnosis not present

## 2016-01-04 DIAGNOSIS — Z90710 Acquired absence of both cervix and uterus: Secondary | ICD-10-CM | POA: Insufficient documentation

## 2016-01-04 DIAGNOSIS — Q059 Spina bifida, unspecified: Secondary | ICD-10-CM | POA: Diagnosis not present

## 2016-01-04 DIAGNOSIS — L03116 Cellulitis of left lower limb: Secondary | ICD-10-CM | POA: Diagnosis present

## 2016-01-04 DIAGNOSIS — F0280 Dementia in other diseases classified elsewhere without behavioral disturbance: Secondary | ICD-10-CM | POA: Insufficient documentation

## 2016-01-04 DIAGNOSIS — B9620 Unspecified Escherichia coli [E. coli] as the cause of diseases classified elsewhere: Secondary | ICD-10-CM | POA: Insufficient documentation

## 2016-01-04 DIAGNOSIS — I5033 Acute on chronic diastolic (congestive) heart failure: Secondary | ICD-10-CM | POA: Insufficient documentation

## 2016-01-04 DIAGNOSIS — Z809 Family history of malignant neoplasm, unspecified: Secondary | ICD-10-CM | POA: Insufficient documentation

## 2016-01-04 DIAGNOSIS — D709 Neutropenia, unspecified: Secondary | ICD-10-CM | POA: Insufficient documentation

## 2016-01-04 DIAGNOSIS — R600 Localized edema: Secondary | ICD-10-CM | POA: Diagnosis not present

## 2016-01-04 DIAGNOSIS — J449 Chronic obstructive pulmonary disease, unspecified: Secondary | ICD-10-CM | POA: Diagnosis present

## 2016-01-04 DIAGNOSIS — Z885 Allergy status to narcotic agent status: Secondary | ICD-10-CM | POA: Insufficient documentation

## 2016-01-04 DIAGNOSIS — M8580 Other specified disorders of bone density and structure, unspecified site: Secondary | ICD-10-CM | POA: Diagnosis not present

## 2016-01-04 DIAGNOSIS — I5032 Chronic diastolic (congestive) heart failure: Secondary | ICD-10-CM | POA: Diagnosis present

## 2016-01-04 DIAGNOSIS — G309 Alzheimer's disease, unspecified: Secondary | ICD-10-CM | POA: Diagnosis not present

## 2016-01-04 DIAGNOSIS — M7989 Other specified soft tissue disorders: Secondary | ICD-10-CM | POA: Diagnosis not present

## 2016-01-04 DIAGNOSIS — L03119 Cellulitis of unspecified part of limb: Secondary | ICD-10-CM

## 2016-01-04 DIAGNOSIS — Z823 Family history of stroke: Secondary | ICD-10-CM | POA: Insufficient documentation

## 2016-01-04 DIAGNOSIS — Z888 Allergy status to other drugs, medicaments and biological substances status: Secondary | ICD-10-CM | POA: Diagnosis not present

## 2016-01-04 DIAGNOSIS — L0390 Cellulitis, unspecified: Secondary | ICD-10-CM | POA: Diagnosis present

## 2016-01-04 DIAGNOSIS — C8590 Non-Hodgkin lymphoma, unspecified, unspecified site: Secondary | ICD-10-CM | POA: Insufficient documentation

## 2016-01-04 DIAGNOSIS — N390 Urinary tract infection, site not specified: Secondary | ICD-10-CM | POA: Insufficient documentation

## 2016-01-04 HISTORY — DX: Spina bifida, unspecified: Q05.9

## 2016-01-04 HISTORY — DX: Non-Hodgkin lymphoma, unspecified, unspecified site: C85.90

## 2016-01-04 LAB — CBC WITH DIFFERENTIAL/PLATELET
BASOS ABS: 0 10*3/uL (ref 0.0–0.1)
Basophils Relative: 0 %
EOS ABS: 0.1 10*3/uL (ref 0.0–0.7)
Eosinophils Relative: 5 %
HCT: 40 % (ref 36.0–46.0)
HEMOGLOBIN: 13.2 g/dL (ref 12.0–15.0)
LYMPHS ABS: 0.5 10*3/uL — AB (ref 0.7–4.0)
LYMPHS PCT: 35 %
MCH: 31.4 pg (ref 26.0–34.0)
MCHC: 33 g/dL (ref 30.0–36.0)
MCV: 95 fL (ref 78.0–100.0)
MONO ABS: 0.2 10*3/uL (ref 0.1–1.0)
Monocytes Relative: 15 %
NEUTROS ABS: 0.7 10*3/uL — AB (ref 1.7–7.7)
Neutrophils Relative %: 45 %
PLATELETS: 185 10*3/uL (ref 150–400)
RBC: 4.21 MIL/uL (ref 3.87–5.11)
RDW: 13 % (ref 11.5–15.5)
WBC Morphology: INCREASED
WBC: 1.5 10*3/uL — ABNORMAL LOW (ref 4.0–10.5)

## 2016-01-04 LAB — COMPREHENSIVE METABOLIC PANEL
ALBUMIN: 3.8 g/dL (ref 3.5–5.0)
ALK PHOS: 132 U/L — AB (ref 38–126)
ALT: 28 U/L (ref 14–54)
ANION GAP: 7 (ref 5–15)
AST: 35 U/L (ref 15–41)
BILIRUBIN TOTAL: 1.2 mg/dL (ref 0.3–1.2)
BUN: 23 mg/dL — ABNORMAL HIGH (ref 6–20)
CALCIUM: 9.1 mg/dL (ref 8.9–10.3)
CO2: 27 mmol/L (ref 22–32)
CREATININE: 0.71 mg/dL (ref 0.44–1.00)
Chloride: 104 mmol/L (ref 101–111)
GFR calc non Af Amer: >60 mL/min (ref >60)
GLUCOSE: 103 mg/dL — AB (ref 65–99)
Potassium: 3.9 mmol/L (ref 3.5–5.1)
Sodium: 138 mmol/L (ref 135–145)
TOTAL PROTEIN: 7.1 g/dL (ref 6.5–8.1)

## 2016-01-04 LAB — URINE MICROSCOPIC-ADD ON: RBC / HPF: NONE SEEN RBC/hpf (ref 0–5)

## 2016-01-04 LAB — BRAIN NATRIURETIC PEPTIDE: B NATRIURETIC PEPTIDE 5: 30.5 pg/mL (ref 0.0–100.0)

## 2016-01-04 LAB — URINALYSIS, ROUTINE W REFLEX MICROSCOPIC
Bilirubin Urine: NEGATIVE
GLUCOSE, UA: NEGATIVE mg/dL
Hgb urine dipstick: NEGATIVE
Ketones, ur: NEGATIVE mg/dL
LEUKOCYTES UA: NEGATIVE
Nitrite: POSITIVE — AB
PH: 6.5 (ref 5.0–8.0)
Protein, ur: NEGATIVE mg/dL
SPECIFIC GRAVITY, URINE: 1.024 (ref 1.005–1.030)

## 2016-01-04 LAB — D-DIMER, QUANTITATIVE: D-Dimer, Quant: 1.08 ug/mL-FEU — ABNORMAL HIGH (ref 0.00–0.50)

## 2016-01-04 LAB — I-STAT TROPONIN, ED: TROPONIN I, POC: 0 ng/mL (ref 0.00–0.08)

## 2016-01-04 MED ORDER — FUROSEMIDE 10 MG/ML IJ SOLN
20.0000 mg | Freq: Once | INTRAMUSCULAR | Status: AC
  Administered 2016-01-04: 20 mg via INTRAVENOUS
  Filled 2016-01-04: qty 4

## 2016-01-04 MED ORDER — CEFAZOLIN IN D5W 1 GM/50ML IV SOLN
1.0000 g | Freq: Once | INTRAVENOUS | Status: AC
  Administered 2016-01-04: 1 g via INTRAVENOUS
  Filled 2016-01-04: qty 50

## 2016-01-04 NOTE — ED Notes (Signed)
MD at bedside. 

## 2016-01-04 NOTE — ED Notes (Signed)
Pt place on bedpan. 

## 2016-01-04 NOTE — H&P (Signed)
Jill Flowers [id number] DOB: [birth year] DOA: 01/04/2016     PCP: No primary care provider on file.   Outpatient Specialists: [facility] Patient coming from:  home Lives   With family   Chief Complaint: Leg swelling and pain  HPI: Jill Flowers is a 82 y.o. female with medical history significant of chronic neutropenia, hyperlipidemia  Spina bifida, pelvic mass, history of lymphoma in remission was never confirmed history of emphysema, dementia  Presented with history of bilateral pitting edema of the legs for at least one month but have had worsening swelling and redness. Recently she has been getting more short of breath with exertion.  Daughter wanted to bring her to the doctor but patient refused to go so her daughter called 911 The pain in her legs have been so bad that she cannot walk. Her left leg has been more red and swollen. No chest pain, no fever no nausea or vomiting.     IN ER: Afebrile heart rate 78 blood pressure 112/70 PVC 1.5 with ANC 0.7 which is around chronic Platelets 185 BUN 23 creatinine 0.71 Chest x-ray unremarkable She was started on Ancef  Hospitalist was called for admission for cellulitis  Review of Systems:    Pertinent positives include: Leg swelling Bilateral lower extremity swelling leg pain  Constitutional:  No weight loss, night sweats, Fevers, chills, fatigue, weight loss  HEENT:  No headaches, Difficulty swallowing,Tooth/dental problems,Sore throat,  No sneezing, itching, ear ache, nasal congestion, post nasal drip,  Cardio-vascular:  No chest pain, Orthopnea, PND, anasarca, dizziness, palpitations.no  GI:  No heartburn, indigestion, abdominal pain, nausea, vomiting, diarrhea, change in bowel habits, loss of appetite, melena, blood in stool, hematemesis Resp:  no shortness of breath at rest. No dyspnea on exertion, No excess mucus, no productive cough, No non-productive cough, No coughing up of blood.No change in color of  mucus.No wheezing. Skin:  no rash or lesions. No jaundice GU:  no dysuria, change in color of urine, no urgency or frequency. No straining to urinate.  No flank pain.  Musculoskeletal:  No joint pain or no joint swelling. No decreased range of motion. No back pain.  Psych:  No change in mood or affect. No depression or anxiety. No memory loss.  Neuro: no localizing neurological complaints, no tingling, no weakness, no double vision, no gait abnormality, no slurred speech, no confusion  As per HPI otherwise 10 point review of systems negative.   Past Medical History: Past Medical History  Diagnosis Date  . Spina bifida ([place])   . Lymphoma ([place])     "mild" per daughter  . Lymphoma ([place])     "mild" per daughter   Past Surgical History  Procedure Laterality Date  . Abdominal hysterectomy    . Spinda bifida      when she was 82 yo     Social History:  Ambulatory  Gilford Rile     reports that she has never smoked. She does not have any smokeless tobacco history on file. She reports that she does not use illicit drugs. Her alcohol history is not on file.  Allergies:   Allergies  Allergen Reactions  . Dilaudid [Hydromorphone Hcl]   . Hydrocodone-Acetaminophen     REACTION: N \\T \ V  . Penicillins     REACTION: swelling Has patient had a PCN reaction causing immediate rash, facial/tongue/throat swelling, SOB or lightheadedness with hypotension: Yes Has patient had a PCN reaction causing severe rash involving mucus membranes or skin  necrosis: No Has patient had a PCN reaction that required hospitalization No Has patient had a PCN reaction occurring within the last 10 years: No If all of the above answers are "NO", then may proceed with Cephalosporin use.   . Tramadol     Family History:    Family History  Problem Relation Age of Onset  . Cancer Father   . Cancer Sister   . Stroke Brother     Medications: Prior to Admission medications   Not on File    Physical  Exam: Patient Vitals for the past 24 hrs:  BP Temp Temp src Pulse Resp SpO2  01/04/16 2155 (!) 132/51 mmHg - - 77 16 96 %  01/04/16 1947 133/64 mmHg 97.9 F (36.6 C) Oral 81 18 99 %    1. General:  in No Acute distress 2. Psychological: Alert and  Oriented 3. Head/ENT:   Moist  Mucous Membranes                          Head Non traumatic, neck supple                           Poor Dentition 4. SKIN:  decreased Skin turgor,  Skin clean Dry and intact no rash 5. Heart: Regular rate and rhythm no  Murmur, Rub or gallop 6. Lungs:  no wheezes or crackles   7. Abdomen: Soft, non-tender, Non distended 8. Lower extremities: no clubbing, cyanosis significant bilateral edema right worse than left.    9. Neurologically Grossly intact, moving all 4 extremities equally 10. MSK: Normal range of motion   body mass index is unknown because there is no weight on file.  Labs on Admission:   Labs on Admission: I have personally reviewed following labs and imaging studies  CBC:  Recent Labs Lab 01/04/16 2014  WBC 1.5*  NEUTROABS 0.7*  HGB 13.2  HCT 40.0  MCV 95.0  PLT 123XX123   Basic Metabolic Panel:  Recent Labs Lab 01/04/16 2014  NA 138  K 3.9  CL 104  CO2 27  GLUCOSE 103*  BUN 23*  CREATININE 0.71  CALCIUM 9.1   GFR: CrCl cannot be calculated (Unknown ideal weight.). Liver Function Tests:  Recent Labs Lab 01/04/16 2014  AST 35  ALT 28  ALKPHOS 132*  BILITOT 1.2  PROT 7.1  ALBUMIN 3.8   No results for input(s): LIPASE, AMYLASE in the last 168 hours. No results for input(s): AMMONIA in the last 168 hours. Coagulation Profile: No results for input(s): INR, PROTIME in the last 168 hours. Cardiac Enzymes: No results for input(s): CKTOTAL, CKMB, CKMBINDEX, TROPONINI in the last 168 hours. BNP (last 3 results) No results for input(s): PROBNP in the last 8760 hours. HbA1C: No results for input(s): HGBA1C in the last 72 hours. CBG: No results for input(s): GLUCAP in  the last 168 hours. Lipid Profile: No results for input(s): CHOL, HDL, LDLCALC, TRIG, CHOLHDL, LDLDIRECT in the last 72 hours. Thyroid Function Tests: No results for input(s): TSH, T4TOTAL, FREET4, T3FREE, THYROIDAB in the last 72 hours. Anemia Panel: No results for input(s): VITAMINB12, FOLATE, FERRITIN, TIBC, IRON, RETICCTPCT in the last 72 hours. Urine analysis:    Component Value Date/Time   COLORURINE AMBER BIOCHEMICALS MAY BE AFFECTED BY COLOR* 07/10/2009 0832   APPEARANCEUR CLEAR 07/10/2009 0832   LABSPEC 1.031* 07/10/2009 0832   LABSPEC 1.015 09/15/2007 1001   PHURINE 5.5  07/10/2009 0832   PHURINE 6.5 09/15/2007 1001   GLUCOSEU NEGATIVE 07/10/2009 0832   HGBUR NEGATIVE 07/10/2009 0832   HGBUR Negative 09/15/2007 1001   BILIRUBINUR SMALL* 07/10/2009 0832   BILIRUBINUR Negative 09/15/2007 1001   KETONESUR NEGATIVE 07/10/2009 0832   KETONESUR Negative 09/15/2007 1001   PROTEINUR 30* 07/10/2009 0832   PROTEINUR Negative 09/15/2007 1001   UROBILINOGEN 1.0 07/10/2009 0832   NITRITE NEGATIVE 07/10/2009 0832   NITRITE Negative 09/15/2007 1001   LEUKOCYTESUR NEGATIVE 07/10/2009 0832   LEUKOCYTESUR Negative 09/15/2007 1001   Sepsis Labs: @LABRCNTIP (procalcitonin:4,lacticidven:4) )No results found for this or any previous visit (from the past 240 hour(s)).    UA not obtained  Lab Results  Component Value Date   HGBA1C  07/10/2009    5.0 (NOTE) The ADA recommends the following therapeutic goal for glycemic control related to Hgb A1c measurement: Goal of therapy: <6.5 Hgb A1c  Reference: American Diabetes Association: Clinical Practice Recommendations 2010, Diabetes Care, 2010, 33: (Suppl  1).    CrCl cannot be calculated (Unknown ideal weight.).  BNP (last 3 results) No results for input(s): PROBNP in the last 8760 hours.   ECG REPORT  Independently reviewed Rate:78   Rhythm: Normal sinus rhythm ST&T Change: Minimal ST elevation in inferior leads unchanged from  prior QTC 433  There were no vitals filed for this visit.   Cultures: No results found for: SDES, [place], CULT, REPTSTATUS   Radiological Exams on Admission: Dg Chest 2 View  01/04/2016  CLINICAL DATA:  Bilateral lower extremity edema for 1 month. EXAM: CHEST  2 VIEW COMPARISON:  04/03/2012 FINDINGS: The lungs are clear except for mild unchanged linear scarring in the left base. There is no pleural effusion. Hilar and mediastinal contours are unremarkable and unchanged. Pulmonary vasculature is normal. IMPRESSION: No active cardiopulmonary disease. Electronically Signed   By: Andreas Newport M.D.   On: 01/04/2016 21:07    Chart has been reviewed    Assessment/Plan   82 y.o. female with medical history significant of chronic neutropenia, hyperlipidemia  Spina bifida, pelvic mass, history of lymphoma in remission was never confirmed history of emphysema here available leg swelling worrisome for mild cellulitis versus DVT   Present on Admission:   . Cellulitis - erythema suggestive of possible mild cellulitis versus venous insufficiency changes for now cover with antibiotics and seizures any improvement given history of neutropenia   history of neutropenia - this is chronic has been going on for years. Family states she has history of mild lymphoma has never followed up of her oncologist: Not taking any medications  . Bilateral leg edema etiology unclear albumin appears to be within normal limits BNP within normal limits we will obtain Dopplers and echo gram chest significant lower extremity swelling that's been going on for past one month and dyspnea with exertion . COPD (chronic obstructive pulmonary disease) (HCC) stable current asymptomatic Dementia stable expect some degree of sundowning while hospitalized Other plan as per orders.  DVT prophylaxis:    Lovenox     Code Status:  FULL CODE as per patient    Family Communication:   Family   at  Bedside  plan of care was  discussed with  Daughter Rivka Barbara [id number]  Disposition Plan:  To home once workup is complete and patient is stable   Consults called: none   Admission status:   obs    Level of care     tele       [place] 01/04/2016, 11:50  PM    [facility]  Pager [phone]   after 2 AM please page floor coverage PA If 7AM-7PM, please contact the day team taking care of the patient  [email or web address]  Password [email or web address]

## 2016-01-04 NOTE — ED Provider Notes (Signed)
CSN: [id number]     Arrival date & time 01/04/16  1926 History   First MD Initiated Contact with Patient 01/04/16 1951     Chief Complaint  Patient presents with  . Leg Swelling     The history is provided by the patient and a relative. No language interpreter was used.   Jill Flowers is a 82 y.o. female who presents to the Emergency Department complaining of leg swelling.  Patient presents with her daughter for evaluation of bilateral lower extremity edema. She reports 1 month of progressive lower extremity edema. She has pain in her legs and cannot walk now due to the pain. She states she has had intermittent swelling in the past that usually goes down in the evening. Lately it has not been improving. She has a history of spina bifida but no other medical problems and takes no medications. She denies fevers, chest pain, shortness of breath. She has a normal appetite and normal urinary output. Symptoms are moderate, constant, worsening. Her daughter made an appointment with family doctor but the patient refused to go so the daughter called 27.  Past Medical History  Diagnosis Date  . Spina bifida ([place])   . Lymphoma ([place])     "mild" per daughter  . Lymphoma ([place])     "mild" per daughter   Past Surgical History  Procedure Laterality Date  . Abdominal hysterectomy    . Spinda bifida      when she was 82 yo   Family History  Problem Relation Age of Onset  . Cancer Father   . Cancer Sister   . Stroke Brother    Social History  Substance Use Topics  . Smoking status: Never Smoker   . Smokeless tobacco: None  . Alcohol Use: None   OB History    No data available     Review of Systems  All other systems reviewed and are negative.     Allergies  Dilaudid; Hydrocodone-acetaminophen; Penicillins; and Tramadol  Home Medications   Prior to Admission medications   Not on File   BP 167/72 mmHg  Pulse 78  Temp(Src) 97.6 F (36.4 C) (Oral)  Resp 18  Ht 5\' 6"  (1.676 m)   Wt 115 lb 1.3 oz (52.2 kg)  BMI 18.58 kg/m2  SpO2 100% Physical Exam  Constitutional: She is oriented to person, place, and time. She appears well-developed and well-nourished.  HENT:  Head: Normocephalic and atraumatic.  Cardiovascular: Normal rate and regular rhythm.   No murmur heard. Pulmonary/Chest: Effort normal and breath sounds normal. No respiratory distress.  Abdominal: Soft. There is no tenderness. There is no rebound and no guarding.  Musculoskeletal:  2+ DP pulses bilaterally. 3-4+ pitting edema bilateral lower extremities that extends to the hips. There is mild erythema of the left foot and calf.  Neurological: She is alert and oriented to person, place, and time.  Skin: Skin is warm and dry.  Psychiatric: She has a normal mood and affect. Her behavior is normal.  Nursing note and vitals reviewed.   ED Course  Procedures (including critical care time) Labs Review Labs Reviewed  COMPREHENSIVE METABOLIC PANEL - Abnormal; Notable for the following:    Glucose, Bld 103 (*)    BUN 23 (*)    Alkaline Phosphatase 132 (*)    All other components within normal limits  CBC WITH DIFFERENTIAL/PLATELET - Abnormal; Notable for the following:    WBC 1.5 (*)    Neutro Abs 0.7 (*)  Lymphs Abs 0.5 (*)    All other components within normal limits  D-DIMER, QUANTITATIVE (NOT AT [facility]) - Abnormal; Notable for the following:    D-Dimer, Quant 1.08 (*)    All other components within normal limits  URINALYSIS, ROUTINE W REFLEX MICROSCOPIC (NOT AT [facility]) - Abnormal; Notable for the following:    APPearance CLOUDY (*)    Nitrite POSITIVE (*)    All other components within normal limits  URINE MICROSCOPIC-ADD ON - Abnormal; Notable for the following:    Squamous Epithelial / LPF 6-30 (*)    Bacteria, UA MANY (*)    All other components within normal limits  URINE CULTURE  BRAIN NATRIURETIC PEPTIDE  MAGNESIUM  PHOSPHORUS  TSH  COMPREHENSIVE METABOLIC PANEL  CBC  HEMOGLOBIN A1C   I-STAT TROPOININ, ED    Imaging Review Dg Chest 2 View  01/04/2016  CLINICAL DATA:  Bilateral lower extremity edema for 1 month. EXAM: CHEST  2 VIEW COMPARISON:  04/03/2012 FINDINGS: The lungs are clear except for mild unchanged linear scarring in the left base. There is no pleural effusion. Hilar and mediastinal contours are unremarkable and unchanged. Pulmonary vasculature is normal. IMPRESSION: No active cardiopulmonary disease. Electronically Signed   By: Andreas Newport M.D.   On: 01/04/2016 21:07   I have personally reviewed and evaluated these images and lab results as part of my medical decision-making.   EKG Interpretation None      MDM   Final diagnoses:  Cellulitis of left lower extremity    Patient here for evaluation of progressive bilateral lower extremity swelling and pain. Legs are cellulitic on examination. Patient is neutropenic and this appears to be chronic but given the extent her cellulitis will treat with IV antibiotics. Hospitalist consulted for admission for further workup.  Quintella Reichert, MD 01/05/16 1245

## 2016-01-04 NOTE — ED Notes (Signed)
Pt BIB [facility] from home c/o bilateral pitting edema of legs and feet x1 month. Does not take any medication. Hx dementia and spinal bifida.

## 2016-01-04 NOTE — ED Notes (Signed)
Bed: [id number] Expected date:  Expected time:  Means of arrival:  Comments: EMS: 44 y/oF

## 2016-01-05 ENCOUNTER — Observation Stay (HOSPITAL_BASED_OUTPATIENT_CLINIC_OR_DEPARTMENT_OTHER): Payer: Medicare Other

## 2016-01-05 DIAGNOSIS — J410 Simple chronic bronchitis: Secondary | ICD-10-CM | POA: Diagnosis not present

## 2016-01-05 DIAGNOSIS — M7989 Other specified soft tissue disorders: Secondary | ICD-10-CM | POA: Diagnosis not present

## 2016-01-05 DIAGNOSIS — L03116 Cellulitis of left lower limb: Secondary | ICD-10-CM | POA: Diagnosis not present

## 2016-01-05 DIAGNOSIS — R0600 Dyspnea, unspecified: Secondary | ICD-10-CM

## 2016-01-05 DIAGNOSIS — F0280 Dementia in other diseases classified elsewhere without behavioral disturbance: Secondary | ICD-10-CM | POA: Diagnosis not present

## 2016-01-05 DIAGNOSIS — L8990 Pressure ulcer of unspecified site, unspecified stage: Secondary | ICD-10-CM | POA: Insufficient documentation

## 2016-01-05 DIAGNOSIS — G309 Alzheimer's disease, unspecified: Secondary | ICD-10-CM | POA: Diagnosis not present

## 2016-01-05 DIAGNOSIS — R600 Localized edema: Secondary | ICD-10-CM | POA: Diagnosis not present

## 2016-01-05 LAB — COMPREHENSIVE METABOLIC PANEL
ALBUMIN: 3.7 g/dL (ref 3.5–5.0)
ALK PHOS: 132 U/L — AB (ref 38–126)
ALT: 27 U/L (ref 14–54)
ANION GAP: 6 (ref 5–15)
AST: 29 U/L (ref 15–41)
BILIRUBIN TOTAL: 1.2 mg/dL (ref 0.3–1.2)
BUN: 19 mg/dL (ref 6–20)
CALCIUM: 8.9 mg/dL (ref 8.9–10.3)
CO2: 30 mmol/L (ref 22–32)
Chloride: 102 mmol/L (ref 101–111)
Creatinine, Ser: 0.76 mg/dL (ref 0.44–1.00)
GLUCOSE: 101 mg/dL — AB (ref 65–99)
POTASSIUM: 3.4 mmol/L — AB (ref 3.5–5.1)
Sodium: 138 mmol/L (ref 135–145)
TOTAL PROTEIN: 7.1 g/dL (ref 6.5–8.1)

## 2016-01-05 LAB — CBC
HEMATOCRIT: 38.9 % (ref 36.0–46.0)
HEMOGLOBIN: 12.8 g/dL (ref 12.0–15.0)
MCH: 30.8 pg (ref 26.0–34.0)
MCHC: 32.9 g/dL (ref 30.0–36.0)
MCV: 93.5 fL (ref 78.0–100.0)
Platelets: 188 10*3/uL (ref 150–400)
RBC: 4.16 MIL/uL (ref 3.87–5.11)
RDW: 12.7 % (ref 11.5–15.5)
WBC: 1.9 10*3/uL — AB (ref 4.0–10.5)

## 2016-01-05 LAB — ECHOCARDIOGRAM COMPLETE
AVLVOTPG: 6 mmHg
CHL CUP DOP CALC LVOT VTI: 24 cm
E decel time: 275 msec
EERAT: 7.79
FS: 21 % — AB (ref 28–44)
HEIGHTINCHES: 66 in
IVS/LV PW RATIO, ED: 1.12
LA ID, A-P, ES: 22 mm
LA diam end sys: 22 mm
LA diam index: 1.39 cm/m2
LA vol A4C: 37 ml
LA vol index: 18.1 mL/m2
LA vol: 28.6 mL
LDCA: 2.27 cm2
LV PW d: 10.2 mm — AB (ref 0.6–1.1)
LV TDI E'LATERAL: 8.59
LV TDI E'MEDIAL: 6.42
LVEEAVG: 7.79
LVEEMED: 7.79
LVELAT: 8.59 cm/s
LVOT SV: 54 mL
LVOT diameter: 17 mm
LVOTPV: 122 cm/s
MV Dec: 275
MV pk A vel: 83.4 m/s
MVPKEVEL: 66.9 m/s
WEIGHTICAEL: 1841.28 [oz_av]

## 2016-01-05 LAB — TSH: TSH: 4.933 u[IU]/mL — ABNORMAL HIGH (ref 0.350–4.500)

## 2016-01-05 LAB — PHOSPHORUS: PHOSPHORUS: 3.3 mg/dL (ref 2.5–4.6)

## 2016-01-05 LAB — MAGNESIUM: MAGNESIUM: 2.1 mg/dL (ref 1.7–2.4)

## 2016-01-05 MED ORDER — POTASSIUM CHLORIDE CRYS ER 20 MEQ PO TBCR
40.0000 meq | EXTENDED_RELEASE_TABLET | Freq: Four times a day (QID) | ORAL | Status: AC
  Administered 2016-01-05: 40 meq via ORAL
  Filled 2016-01-05: qty 2

## 2016-01-05 MED ORDER — LORAZEPAM 2 MG/ML IJ SOLN
0.5000 mg | Freq: Once | INTRAMUSCULAR | Status: AC
  Administered 2016-01-05: 0.5 mg via INTRAVENOUS
  Filled 2016-01-05: qty 1

## 2016-01-05 MED ORDER — CEFAZOLIN IN D5W 1 GM/50ML IV SOLN
1.0000 g | Freq: Three times a day (TID) | INTRAVENOUS | Status: DC
  Administered 2016-01-05 – 2016-01-06 (×3): 1 g via INTRAVENOUS
  Filled 2016-01-05 (×5): qty 50

## 2016-01-05 MED ORDER — ACETAMINOPHEN 325 MG PO TABS
650.0000 mg | ORAL_TABLET | Freq: Four times a day (QID) | ORAL | Status: DC | PRN
  Administered 2016-01-06: 650 mg via ORAL
  Filled 2016-01-05: qty 2

## 2016-01-05 MED ORDER — SODIUM CHLORIDE 0.9 % IV SOLN: 250.0000 mL | INTRAVENOUS | Status: DC | PRN

## 2016-01-05 MED ORDER — ENOXAPARIN SODIUM 40 MG/0.4ML ~~LOC~~ SOLN
40.0000 mg | SUBCUTANEOUS | Status: DC
  Administered 2016-01-05 – 2016-01-08 (×4): 40 mg via SUBCUTANEOUS
  Filled 2016-01-05 (×4): qty 0.4

## 2016-01-05 MED ORDER — PNEUMOCOCCAL VAC POLYVALENT 25 MCG/0.5ML IJ INJ
0.5000 mL | INJECTION | INTRAMUSCULAR | Status: AC
  Administered 2016-01-06: 0.5 mL via INTRAMUSCULAR
  Filled 2016-01-05 (×2): qty 0.5

## 2016-01-05 MED ORDER — ONDANSETRON HCL 4 MG PO TABS: 4.0000 mg | ORAL_TABLET | Freq: Four times a day (QID) | ORAL | Status: DC | PRN

## 2016-01-05 MED ORDER — SODIUM CHLORIDE 0.9% FLUSH: 3.0000 mL | INTRAVENOUS | Status: DC | PRN

## 2016-01-05 MED ORDER — FUROSEMIDE 10 MG/ML IJ SOLN
40.0000 mg | Freq: Once | INTRAMUSCULAR | Status: AC
  Administered 2016-01-05: 40 mg via INTRAVENOUS
  Filled 2016-01-05: qty 4

## 2016-01-05 MED ORDER — ONDANSETRON HCL 4 MG/2ML IJ SOLN: 4.0000 mg | Freq: Four times a day (QID) | INTRAMUSCULAR | Status: DC | PRN

## 2016-01-05 MED ORDER — ACETAMINOPHEN 650 MG RE SUPP
650.0000 mg | Freq: Four times a day (QID) | RECTAL | Status: DC | PRN
Start: 2016-01-05 — End: 2016-01-05

## 2016-01-05 MED ORDER — SODIUM CHLORIDE 0.9% FLUSH: 3.0000 mL | Freq: Two times a day (BID) | INTRAVENOUS | Status: DC

## 2016-01-05 MED ORDER — SODIUM CHLORIDE 0.9% FLUSH
3.0000 mL | Freq: Two times a day (BID) | INTRAVENOUS | Status: DC
  Administered 2016-01-05: 3 mL via INTRAVENOUS

## 2016-01-05 NOTE — Progress Notes (Signed)
VASCULAR LAB PRELIMINARY  PRELIMINARY  PRELIMINARY  PRELIMINARY  Bilateral lower extremity venous duplex completed.    Preliminary report:  Bilateral:  No evidence of DVT, superficial thrombosis, or Baker's Cyst.   Georgianna Band, RVS 01/05/2016, 3:53 PM

## 2016-01-05 NOTE — Progress Notes (Signed)
  Echocardiogram 2D Echocardiogram has been performed.  Jill Flowers 01/05/2016, 10:58 AM

## 2016-01-05 NOTE — Progress Notes (Signed)
PROGRESS NOTE                                                                                                                                                                                                             Patient Demographics:    Jill Flowers, is a 82 y.o. female, DOB - Sep 16, 1930, [id number]  Admit date - 01/04/2016   Admitting Physician Jill Baker, MD  Outpatient Primary MD for the patient is No primary care provider on file.  LOS -   Chief Complaint  Patient presents with  . Leg Swelling       Brief Narrative    Jill Flowers is a 81 y.o. female with medical history significant of chronic neutropenia, hyperlipidemia, Spina bifida, pelvic mass, history of lymphoma in remission was never confirmed history of emphysema, dementia  Presented with history of bilateral pitting edema of the legs for at least one month but have had worsening swelling and redness. Recently she has been getting more short of breath with exertion. Daughter wanted to bring her to the doctor but patient refused to go so her daughter called 911 The pain in her legs have been so bad that she cannot walk. Her left leg has been more red and swollen. No chest pain, no fever no nausea or vomiting.     IN ER: Afebrile heart rate 78 blood pressure 112/70 PVC 1.5 with ANC 0.7 which is around chronic Platelets 185 BUN 23 creatinine 0.71 Chest x-ray unremarkable She was started on Ancef   Subjective:    Jill Flowers today has, No headache, No chest pain, No abdominal pain - No Nausea, No new weakness tingling or numbness, No Cough - SOB. Very confused and unreliable historian but looks to be in no distress   Assessment  & Plan :     1. Biateral lower extremity edema with some warmth and redness. Appears to be more consistent with chronic edema/venous stasis, lower extremity venous duplex pending, echo pending, we will gently  diurese, doubt cellulitis. She is currently on Ancef will continue till cultures finalized. We will also apply UNNA boots along with gentle diuresis. UA has no proteinuria, liver enzymes stable. Albumin level stable.  2. Dementia.  Poor quality of life at home, discussed with daughter, at risk for delirium, supportive care, minimize benzodiazepines and narcotics. PT  eval.  3. History of low-grade lymphoma with leukopenia. Follows with Dr. Jana Flowers annually, supportive care only.  4. COPD. Stable at baseline no acute issues.   Family Communication  : daughter over the phone  Code Status :  DNR  Diet : heart healthy  Disposition Plan  :  Home 1-2 days  Consults  : None  Procedures  :   TTE  Leg Venous US  DVT Prophylaxis  :  Lovenox   Lab Results  Component Value Date   PLT 188 01/05/2016    Inpatient Medications  Scheduled Meds: .  ceFAZolin (ANCEF) IV  1 g Intravenous Q8H  . enoxaparin (LOVENOX) injection  40 mg Subcutaneous Q24H  . furosemide  40 mg Intravenous Once  . [START ON 01/06/2016] pneumococcal 23 valent vaccine  0.5 mL Intramuscular Tomorrow-1000  . sodium chloride flush  3 mL Intravenous Q12H   Continuous Infusions:  PRN Meds:.acetaminophen **OR** [DISCONTINUED] acetaminophen, [DISCONTINUED] ondansetron **OR** ondansetron (ZOFRAN) IV  Antibiotics  :    Anti-infectives    Start     Dose/Rate Route Frequency Ordered Stop   01/05/16 0600  ceFAZolin (ANCEF) IVPB 1 g/50 mL premix     1 g 100 mL/hr over 30 Minutes Intravenous Every 8 hours 01/05/16 0046     01/04/16 2145  ceFAZolin (ANCEF) IVPB 1 g/50 mL premix     1 g 100 mL/hr over 30 Minutes Intravenous  Once 01/04/16 2139 01/04/16 2229         Objective:   Filed Vitals:   01/04/16 2321 01/05/16 0046 01/05/16 0501 01/05/16 1013  BP: 134/59 167/72 127/49 121/47  Pulse: 76 78 78 81  Temp:  97.6 F (36.4 C) 97.8 F (36.6 C) 98.1 F (36.7 C)  TempSrc:  Oral Oral Axillary  Resp: 22 18 18 16     Height:  5\' 6"  (1.676 m)    Weight:  52.2 kg (115 lb 1.3 oz)    SpO2: 96% 100% 98% 98%    Wt Readings from Last 3 Encounters:  01/05/16 52.2 kg (115 lb 1.3 oz)  07/26/09 45.224 kg (99 lb 11.2 oz)  07/21/08 39.576 kg (87 lb 4 oz)     Intake/Output Summary (Last 24 hours) at 01/05/16 1118 Last data filed at 01/05/16 0507  Gross per 24 hour  Intake     50 ml  Output    300 ml  Net   -250 ml     Physical Exam  Awake But pleasantly confused, No new F.N deficits, Normal affect [place].AT,PERRAL Supple Neck,No JVD, No cervical lymphadenopathy appriciated.  Symmetrical Chest wall movement, Good air movement bilaterally, CTAB RRR,No Gallops,Rubs or new Murmurs, No Parasternal Heave +ve B.Sounds, Abd Soft, No tenderness, No organomegaly appriciated, No rebound - guarding or rigidity. No Cyanosis, Clubbing, No new Rash or bruise, Mild 1+ edema with some erythema and warmth in both lower extremities    01-05-16 picture     Data Review:    CBC  Recent Labs Lab 01/04/16 2014 01/05/16 0528  WBC 1.5* 1.9*  HGB 13.2 12.8  HCT 40.0 38.9  PLT 185 188  MCV 95.0 93.5  MCH 31.4 30.8  MCHC 33.0 32.9  RDW 13.0 12.7  LYMPHSABS 0.5*  --   MONOABS 0.2  --   EOSABS 0.1  --   BASOSABS 0.0  --     Chemistries   Recent Labs Lab 01/04/16 2014 01/05/16 0528  NA 138 138  K 3.9 3.4*  CL 104 102  CO2 27 30  GLUCOSE 103* 101*  BUN 23* 19  CREATININE 0.71 0.76  CALCIUM 9.1 8.9  MG  --  2.1  AST 35 29  ALT 28 27  ALKPHOS 132* 132*  BILITOT 1.2 1.2   ------------------------------------------------------------------------------------------------------------------ No results for input(s): CHOL, HDL, LDLCALC, TRIG, CHOLHDL, LDLDIRECT in the last 72 hours.  Lab Results  Component Value Date   HGBA1C  07/10/2009    5.0 (NOTE) The ADA recommends the following therapeutic goal for glycemic control related to Hgb A1c measurement: Goal of therapy: <6.5 Hgb A1c  Reference: American  Diabetes Association: Clinical Practice Recommendations 2010, Diabetes Care, 2010, 33: (Suppl  1).   ------------------------------------------------------------------------------------------------------------------  Recent Labs  01/05/16 0528  TSH 4.933*   ------------------------------------------------------------------------------------------------------------------ No results for input(s): VITAMINB12, FOLATE, FERRITIN, TIBC, IRON, RETICCTPCT in the last 72 hours.  Coagulation profile No results for input(s): INR, PROTIME in the last 168 hours.   Recent Labs  01/04/16 2134  DDIMER 1.08*    Cardiac Enzymes No results for input(s): CKMB, TROPONINI, MYOGLOBIN in the last 168 hours.  Invalid input(s): CK ------------------------------------------------------------------------------------------------------------------    Component Value Date/Time   BNP 30.5 01/04/2016 2015    Micro Results No results found for this or any previous visit (from the past 240 hour(s)).  Radiology Reports Dg Chest 2 View  01/04/2016  CLINICAL DATA:  Bilateral lower extremity edema for 1 month. EXAM: CHEST  2 VIEW COMPARISON:  04/03/2012 FINDINGS: The lungs are clear except for mild unchanged linear scarring in the left base. There is no pleural effusion. Hilar and mediastinal contours are unremarkable and unchanged. Pulmonary vasculature is normal. IMPRESSION: No active cardiopulmonary disease. Electronically Signed   By: Andreas Newport M.D.   On: 01/04/2016 21:07    Time Spent in minutes  30   Alejandro Gamel K M.D on 01/05/2016 at 11:18 AM  Between 7am to 7pm - Pager - [phone]  After 7pm go to [email or web address] - password [facility]  [facility] -  Office  [phone]

## 2016-01-06 DIAGNOSIS — M7989 Other specified soft tissue disorders: Secondary | ICD-10-CM | POA: Diagnosis not present

## 2016-01-06 LAB — BASIC METABOLIC PANEL
ANION GAP: 7 (ref 5–15)
BUN: 21 mg/dL — ABNORMAL HIGH (ref 6–20)
CO2: 31 mmol/L (ref 22–32)
Calcium: 9.3 mg/dL (ref 8.9–10.3)
Chloride: 98 mmol/L — ABNORMAL LOW (ref 101–111)
Creatinine, Ser: 0.89 mg/dL (ref 0.44–1.00)
GFR, EST NON AFRICAN AMERICAN: 58 mL/min — AB (ref >60)
GLUCOSE: 107 mg/dL — AB (ref 65–99)
POTASSIUM: 4.8 mmol/L (ref 3.5–5.1)
Sodium: 136 mmol/L (ref 135–145)

## 2016-01-06 LAB — HEMOGLOBIN A1C
HEMOGLOBIN A1C: 4.9 % (ref 4.8–5.6)
MEAN PLASMA GLUCOSE: 94 mg/dL

## 2016-01-06 MED ORDER — CEFPODOXIME PROXETIL 200 MG PO TABS
200.0000 mg | ORAL_TABLET | Freq: Two times a day (BID) | ORAL | Status: DC
  Administered 2016-01-06 – 2016-01-07 (×4): 200 mg via ORAL
  Filled 2016-01-06 (×5): qty 1

## 2016-01-06 MED ORDER — FUROSEMIDE 40 MG PO TABS
40.0000 mg | ORAL_TABLET | Freq: Once | ORAL | Status: AC
  Administered 2016-01-06: 40 mg via ORAL
  Filled 2016-01-06: qty 1

## 2016-01-06 NOTE — Plan of Care (Signed)
Problem: Pain Managment: Goal: General experience of comfort will improve Outcome: Progressing Denies pain at rest, however feet are very tender to any manipulation.

## 2016-01-06 NOTE — Progress Notes (Signed)
PROGRESS NOTE                                                                                                                                                                                                             Patient Demographics:    Jill Flowers, is a 82 y.o. female, DOB - October 05, 1930, [id number]  Admit date - 01/04/2016   Admitting Physician Toy Baker, MD  Outpatient Primary MD for the patient is No primary care provider on file.  LOS -   Chief Complaint  Patient presents with  . Leg Swelling       Brief Narrative    Jill Flowers is a 81 y.o. female with medical history significant of chronic neutropenia, hyperlipidemia, Spina bifida, pelvic mass, history of lymphoma in remission was never confirmed history of emphysema, dementia  Presented with history of bilateral pitting edema of the legs for at least one month but have had worsening swelling and redness. Recently she has been getting more short of breath with exertion. Daughter wanted to bring her to the doctor but patient refused to go so her daughter called 911 The pain in her legs have been so bad that she cannot walk. Her left leg has been more red and swollen. No chest pain, no fever no nausea or vomiting.     IN ER: Afebrile heart rate 78 blood pressure 112/70 PVC 1.5 with ANC 0.7 which is around chronic Platelets 185 BUN 23 creatinine 0.71 Chest x-ray unremarkable She was started on Ancef   Subjective:    Jill Flowers today has, No headache, No chest pain, No abdominal pain - No Nausea, No new weakness tingling or numbness, No Cough - SOB. Very confused and unreliable historian but looks to be in no distress   Assessment  & Plan :     1. Biateral lower extremity edema with some warmth and redness. Appears to be more consistent with chronic edema/venous stasis, lower extremity venous duplex pending, echo pending, we will gently  diurese, doubt cellulitis. She is currently on Ancef will continue till cultures finalized. We will also apply UNNA boots along with gentle diuresis. UA has no proteinuria, liver enzymes stable. Albumin level stable.  2. Dementia.  Poor quality of life at home, discussed with daughter, at risk for delirium, supportive care, minimize benzodiazepines and narcotics. PT  eval.  3. History of low-grade lymphoma with leukopenia. Follows with Dr. Jana Hakim annually, supportive care only.  4. COPD. Stable at baseline no acute issues.  5. Mild acute on chronic diastolic CHF EF [id number]. Diurese and monitor.   Family Communication  : Daughter bedside  Code Status :  DNR  Diet : heart healthy  Disposition Plan  :  Home 1-2 days  Consults  : None  Procedures  :   TTE Left ventricle: The cavity size was normal. Wall thickness was normal. Systolic function was vigorous. The estimated ejection fraction was in the range of 65% to 70%. Wall motion was normal; there were no regional wall motion abnormalities. Doppler parameters are consistent with abnormal left ventricular relaxation (grade 1 diastolic dysfunction).  Leg Venous US - Preliminary report: Bilateral: No evidence of DVT, superficial thrombosis, or Baker's Cyst.  DVT Prophylaxis  :  Lovenox   Lab Results  Component Value Date   PLT 188 01/05/2016    Inpatient Medications  Scheduled Meds: . cefpodoxime  200 mg Oral Q12H  . enoxaparin (LOVENOX) injection  40 mg Subcutaneous Q24H  . furosemide  40 mg Oral Once  . pneumococcal 23 valent vaccine  0.5 mL Intramuscular Tomorrow-1000   Continuous Infusions:  PRN Meds:.acetaminophen **OR** [DISCONTINUED] acetaminophen, [DISCONTINUED] ondansetron **OR** ondansetron (ZOFRAN) IV  Antibiotics  :    Anti-infectives    Start     Dose/Rate Route Frequency Ordered Stop   01/06/16 1000  cefpodoxime (VANTIN) tablet 200 mg     200 mg Oral Every 12 hours 01/06/16 0907     01/05/16 0600  ceFAZolin  (ANCEF) IVPB 1 g/50 mL premix  Status:  Discontinued     1 g 100 mL/hr over 30 Minutes Intravenous Every 8 hours 01/05/16 0046 01/06/16 0907   01/04/16 2145  ceFAZolin (ANCEF) IVPB 1 g/50 mL premix     1 g 100 mL/hr over 30 Minutes Intravenous  Once 01/04/16 2139 01/04/16 2229         Objective:   Filed Vitals:   01/05/16 1445 01/05/16 1818 01/05/16 2148 01/06/16 0521  BP: 138/57 126/60 135/54 133/74  Pulse: 87 90 94 74  Temp: 98.2 F (36.8 C) 97.9 F (36.6 C) 99.2 F (37.3 C) 97.8 F (36.6 C)  TempSrc: Axillary Oral Oral Oral  Resp: 12 16 16 18   Height:      Weight:      SpO2: 99% 100% 99% 99%    Wt Readings from Last 3 Encounters:  01/05/16 52.2 kg (115 lb 1.3 oz)  07/26/09 45.224 kg (99 lb 11.2 oz)  07/21/08 39.576 kg (87 lb 4 oz)     Intake/Output Summary (Last 24 hours) at 01/06/16 1001 Last data filed at 01/05/16 1820  Gross per 24 hour  Intake    120 ml  Output      0 ml  Net    120 ml     Physical Exam  Awake But pleasantly confused, No new F.N deficits, Normal affect [place].AT,PERRAL Supple Neck,No JVD, No cervical lymphadenopathy appriciated.  Symmetrical Chest wall movement, Good air movement bilaterally, CTAB RRR,No Gallops,Rubs or new Murmurs, No Parasternal Heave +ve B.Sounds, Abd Soft, No tenderness, No organomegaly appriciated, No rebound - guarding or rigidity. No Cyanosis, Clubbing, No new Rash or bruise, Mild 1+ edema with some erythema and warmth in both lower extremities    01-05-16 picture     Data Review:    CBC  Recent Labs Lab 01/04/16 2014 01/05/16  0528  WBC 1.5* 1.9*  HGB 13.2 12.8  HCT 40.0 38.9  PLT 185 188  MCV 95.0 93.5  MCH 31.4 30.8  MCHC 33.0 32.9  RDW 13.0 12.7  LYMPHSABS 0.5*  --   MONOABS 0.2  --   EOSABS 0.1  --   BASOSABS 0.0  --     Chemistries   Recent Labs Lab 01/04/16 2014 01/05/16 0528 01/06/16 0505  NA 138 138 136  K 3.9 3.4* 4.8  CL 104 102 98*  CO2 27 30 31   GLUCOSE 103* 101* 107*    BUN 23* 19 21*  CREATININE 0.71 0.76 0.89  CALCIUM 9.1 8.9 9.3  MG  --  2.1  --   AST 35 29  --   ALT 28 27  --   ALKPHOS 132* 132*  --   BILITOT 1.2 1.2  --    ------------------------------------------------------------------------------------------------------------------ No results for input(s): CHOL, HDL, LDLCALC, TRIG, CHOLHDL, LDLDIRECT in the last 72 hours.  Lab Results  Component Value Date   HGBA1C 4.9 01/05/2016   ------------------------------------------------------------------------------------------------------------------  Recent Labs  01/05/16 0528  TSH 4.933*   ------------------------------------------------------------------------------------------------------------------ No results for input(s): VITAMINB12, FOLATE, FERRITIN, TIBC, IRON, RETICCTPCT in the last 72 hours.  Coagulation profile No results for input(s): INR, PROTIME in the last 168 hours.   Recent Labs  01/04/16 2134  DDIMER 1.08*    Cardiac Enzymes No results for input(s): CKMB, TROPONINI, MYOGLOBIN in the last 168 hours.  Invalid input(s): CK ------------------------------------------------------------------------------------------------------------------    Component Value Date/Time   BNP 30.5 01/04/2016 2015    Micro Results Recent Results (from the past 240 hour(s))  Urine culture     Status: Abnormal (Preliminary result)   Collection Time: 01/04/16 11:29 PM  Result Value Ref Range Status   Specimen Description URINE, CLEAN CATCH  Final   Special Requests Immunocompromised  Final   Culture >=100,000 COLONIES/mL GRAM NEGATIVE RODS (A)  Final   Report Status PENDING  Incomplete    Radiology Reports Dg Chest 2 View  01/04/2016  CLINICAL DATA:  Bilateral lower extremity edema for 1 month. EXAM: CHEST  2 VIEW COMPARISON:  04/03/2012 FINDINGS: The lungs are clear except for mild unchanged linear scarring in the left base. There is no pleural effusion. Hilar and mediastinal  contours are unremarkable and unchanged. Pulmonary vasculature is normal. IMPRESSION: No active cardiopulmonary disease. Electronically Signed   By: Andreas Newport M.D.   On: 01/04/2016 21:07    Time Spent in minutes  30   SINGH,PRASHANT K M.D on 01/06/2016 at 10:01 AM  Between 7am to 7pm - Pager - [phone]  After 7pm go to [email or web address] - password [facility]  [facility] -  Office  [phone]

## 2016-01-06 NOTE — Plan of Care (Signed)
Problem: Skin Integrity: Goal: Risk for impaired skin integrity will decrease Outcome: Progressing Both legs unwrapped and according to pt daughter, the swelling and redness have decreased some.  Bilateral legs with non-pitting edema, very tender at foot to touch, temperature normal, mild redness and dry flaking skin noted.  Both legs re-wrapped and MD notified of improvement.

## 2016-01-06 NOTE — Evaluation (Signed)
Physical Therapy Evaluation Patient Details Name: Jill Flowers MRN: [id number] DOB: 1930/09/24 Today's Date: 01/06/2016   History of Present Illness  Jill Flowers is a 82 y.o. female with medical history significant of chronic neutropenia, hyperlipidemia,Spina bifida, pelvic mass, history of lymphoma ,history of emphysema, dementia. Admitted 6/29 with worsening reddness/cellulitis of the legs.  Clinical Impression  The patient is pleasantly confused and  incontinent. No family present to get info  About patient's prior level of function, the patient gave mixed info. Sounds like she has a WC but was ambulating and having multiple falls.  Pt admitted with above diagnosis. Pt currently with functional limitations due to the deficits listed below (see PT Problem List).  Pt will benefit from skilled PT to increase their independence and safety with mobility to allow discharge to the venue listed below.       Follow Up Recommendations SNF;Supervision/Assistance - 24 hour    Equipment Recommendations  None recommended by PT    Recommendations for Other Services       Precautions / Restrictions Precautions Precautions: Fall Precaution Comments: multiple falls per patient.. incontinent      Mobility  Bed Mobility Overal bed mobility: Needs Assistance Bed Mobility: Rolling;Supine to Sit;Sit to Supine Rolling: Min assist   Supine to sit: Mod assist;HOB elevated Sit to supine: Mod assist   General bed mobility comments: use of the rail, legs remain extended  when rolling, rolls to left and right with rail. The patient did slide the legs to the bed edge, assist with the trunk to sit upright using HOB to assist and mod assist to fully sit upright. patient incontinent of large amount of urine  Transfers Overall transfer level: Needs assistance Equipment used: Rolling walker (2 wheeled) Transfers: Sit to/from Stand Sit to Stand: Max assist;From elevated surface         General transfer  comment: attempted x 2 t stand, the patient was unable to stand. C/o pain of the feet  Ambulation/Gait                Stairs            Wheelchair Mobility    Modified Rankin (Stroke Patients Only)       Balance                                             Pertinent Vitals/Pain Pain Assessment: Faces Faces Pain Scale: Hurts even more Pain Location: when the legs are touched and picked up Pain Descriptors / Indicators: Discomfort;Grimacing;Guarding Pain Intervention(s): Repositioned;Monitored during session    [place] expects to be discharged to:: Private residence Living Arrangements: Alone;Children Available Help at Discharge: Available PRN/intermittently Type of Home: House Home Access: Ramped entrance     Home Layout: One level Home Equipment: [place] - 2 wheels;Wheelchair - manual      Prior Function Level of Independence: Needs assistance   Gait / Transfers Assistance Needed: patient not clear ion information, she may use a RW and the WC  ADL's / Homemaking Assistance Needed: family assists        Hand Dominance        Extremity/Trunk Assessment   Upper Extremity Assessment: Generalized weakness           Lower Extremity Assessment: LLE deficits/detail;RLE deficits/detail RLE Deficits / Details: decreased knee flexion,  unable to pick the  leg from bed, attempted to stand but did not fel  like she could stand LLE Deficits / Details: same as the right     Communication      Cognition Arousal/Alertness: Awake/alert Behavior During Therapy: WFL for tasks assessed/performed Overall Cognitive Status: No family/caregiver present to determine baseline cognitive functioning Area of Impairment: Orientation;Awareness Orientation Level: Place;Time   Memory: Decreased short-term memory              General Comments      Exercises        Assessment/Plan    PT Assessment Patient needs  continued PT services  PT Diagnosis Difficulty walking;Acute pain;Generalized weakness   PT Problem List Decreased strength;Decreased range of motion;Decreased activity tolerance;Decreased balance;Decreased mobility;Decreased cognition;Decreased knowledge of precautions;Decreased safety awareness;Decreased knowledge of use of DME;Pain  PT Treatment Interventions DME instruction;Gait training;Functional mobility training;Therapeutic activities;Therapeutic exercise;Patient/family education   PT Goals (Current goals can be found in the Care Plan section) Acute Rehab PT Goals Patient Stated Goal: to go somewhere PT Goal Formulation: With patient Time For Goal Achievement: 01/20/16 Potential to Achieve Goals: Fair    Frequency Min 3X/week   Barriers to discharge Decreased caregiver support      Co-evaluation               End of Session Equipment Utilized During Treatment: Gait belt Activity Tolerance: Patient limited by fatigue Patient left: in bed;with call bell/phone within reach;with bed alarm set Nurse Communication: Mobility status    Functional Assessment Tool Used: clinical judgement Functional Limitation: Mobility: Walking and moving around Mobility: Walking and Moving Around Current Status [id number]): At least 80 percent but less than 100 percent impaired, limited or restricted Mobility: Walking and Moving Around Goal Status [phone]): At least 20 percent but less than 40 percent impaired, limited or restricted    Time: 1431-1502 PT Time Calculation (min) (ACUTE ONLY): 31 min   Charges:   PT Evaluation $PT Eval Low Complexity: 1 Procedure PT Treatments $Therapeutic Activity: 8-22 mins   PT G Codes:   PT G-Codes **NOT FOR INPATIENT CLASS** Functional Assessment Tool Used: clinical judgement Functional Limitation: Mobility: Walking and moving around Mobility: Walking and Moving Around Current Status [id number]): At least 80 percent but less than 100 percent impaired,  limited or restricted Mobility: Walking and Moving Around Goal Status [phone]): At least 20 percent but less than 40 percent impaired, limited or restricted    Jill Flowers 01/06/2016, 3:44 PM Tresa Endo PT [phone]

## 2016-01-07 ENCOUNTER — Observation Stay (HOSPITAL_COMMUNITY): Payer: Medicare Other

## 2016-01-07 DIAGNOSIS — R600 Localized edema: Secondary | ICD-10-CM | POA: Diagnosis not present

## 2016-01-07 DIAGNOSIS — M7989 Other specified soft tissue disorders: Secondary | ICD-10-CM | POA: Diagnosis not present

## 2016-01-07 DIAGNOSIS — J410 Simple chronic bronchitis: Secondary | ICD-10-CM | POA: Diagnosis not present

## 2016-01-07 LAB — URIC ACID: Uric Acid, Serum: 6.8 mg/dL — ABNORMAL HIGH (ref 2.3–6.6)

## 2016-01-07 LAB — URINE CULTURE: Culture: >=100000 — AB

## 2016-01-07 MED ORDER — COLCHICINE 0.6 MG PO TABS
0.6000 mg | ORAL_TABLET | Freq: Every day | ORAL | Status: DC
  Administered 2016-01-07 – 2016-01-08 (×2): 0.6 mg via ORAL
  Filled 2016-01-07 (×2): qty 1

## 2016-01-07 MED ORDER — PREDNISONE 50 MG PO TABS
50.0000 mg | ORAL_TABLET | Freq: Every day | ORAL | Status: DC
Start: 2016-01-07 — End: 2016-01-08
  Administered 2016-01-07 – 2016-01-08 (×2): 50 mg via ORAL
  Filled 2016-01-07: qty 1

## 2016-01-07 MED ORDER — PREDNISONE 50 MG PO TABS
50.0000 mg | ORAL_TABLET | Freq: Every day | ORAL | Status: DC
  Filled 2016-01-07: qty 1

## 2016-01-07 MED ORDER — METHYLPREDNISOLONE SODIUM SUCC 125 MG IJ SOLR
60.0000 mg | INTRAMUSCULAR | Status: DC
  Filled 2016-01-07: qty 2

## 2016-01-07 NOTE — Clinical Social Work Placement (Signed)
   CLINICAL SOCIAL WORK PLACEMENT  NOTE  Date:  01/07/2016  Patient Details  Name: Jill Flowers MRN: [id number] Date of Birth: Jun 18, 1931  Clinical Social Work is seeking post-discharge placement for this patient at the [place] level of care (*CSW will initial, date and re-position this form in  chart as items are completed):  Yes   Patient/family provided with [place] Work Department's list of facilities offering this level of care within the geographic area requested by the patient (or if unable, by the patient's family).  Yes   Patient/family informed of their freedom to choose among providers that offer the needed level of care, that participate in Medicare, Medicaid or managed care program needed by the patient, have an available bed and are willing to accept the patient.  Yes   Patient/family informed of [place]'s ownership interest in [facility] and [facility], as well as of the fact that they are under no obligation to receive care at these facilities.  PASRR submitted to EDS on 01/07/16     PASRR number received on 01/07/16     Existing PASRR number confirmed on       FL2 transmitted to all facilities in geographic area requested by pt/family on 01/07/16     FL2 transmitted to all facilities within larger geographic area on       Patient informed that his/her managed care company has contracts with or will negotiate with certain facilities, including the following:            Patient/family informed of bed offers received.  Patient chooses bed at       Physician recommends and patient chooses bed at      Patient to be transferred to   on  .  Patient to be transferred to facility by       Patient family notified on   of transfer.  Name of family member notified:        PHYSICIAN Please sign FL2, Please sign DNR     Additional Comment:    _______________________________________________ Caroline Sauger,  LCSW 01/07/2016, 9:37 AM

## 2016-01-07 NOTE — Progress Notes (Signed)
PROGRESS NOTE                                                                                                                                                                                                             Patient Demographics:    Jill Flowers, is a 82 y.o. female, DOB - Mar 19, 1931, [id number]  Admit date - 01/04/2016   Admitting Physician Jill Baker, MD  Outpatient Primary MD for the patient is No primary care provider on file.  LOS -   Chief Complaint  Patient presents with  . Leg Swelling       Brief Narrative    Jill Flowers is a 82 y.o. female with medical history significant of chronic neutropenia, hyperlipidemia, Spina bifida, pelvic mass, history of lymphoma in remission was never confirmed history of emphysema, dementia  Presented with history of bilateral pitting edema of the legs for at least one month but have had worsening swelling and redness. Recently she has been getting more short of breath with exertion. Daughter wanted to bring her to the doctor but patient refused to go so her daughter called 911 The pain in her legs have been so bad that she cannot walk. Her left leg has been more red and swollen. No chest pain, no fever no nausea or vomiting.     IN ER: Afebrile heart rate 78 blood pressure 112/70 PVC 1.5 with ANC 0.7 which is around chronic Platelets 185 BUN 23 creatinine 0.71 Chest x-ray unremarkable She was started on Ancef   Subjective:    Jill Flowers today has, No headache, No chest pain, No abdominal pain - No Nausea, No new weakness tingling or numbness, No Cough - SOB. Very confused and unreliable historian but looks to be in no distress, Says her left foot hurts when she stands on it. Cannot tell me how long this has been going on.   Assessment  & Plan :     1. Biateral lower extremity edema with some warmth and redness. Appears to be more consistent with  chronic edema/venous stasis, lower extremity venous duplex pending, echo pending, we will gently diurese, doubt cellulitis. She is currently on Ancef will continue till cultures finalized. We will also apply UNNA boots along with gentle diuresis. UA has no proteinuria, liver enzymes stable. Albumin level stable.  2. Dementia.  Poor quality of life at home, discussed with daughter, at risk for delirium, supportive care, minimize benzodiazepines and narcotics. PT eval.  3. History of low-grade lymphoma with leukopenia. Follows with Dr. Jana Hakim annually, supportive care only.  4. COPD. Stable at baseline no acute issues.  5. Mild acute on chronic diastolic CHF EF [id number]. Diurese and monitor.  6. L foot pain due to gout - x-ray stable, placed on colchicine and Solu-Medrol will monitor.   Family Communication  : Daughter bedside  Code Status :  DNR  Diet : heart healthy  Disposition Plan  :  Discharge in the morning, SNF if family agrees  Consults  : None  Procedures  :   TTE Left ventricle: The cavity size was normal. Wall thickness was normal. Systolic function was vigorous. The estimated ejection fraction was in the range of 65% to 70%. Wall motion was normal; there were no regional wall motion abnormalities. Doppler parameters are consistent with abnormal left ventricular relaxation (grade 1 diastolic dysfunction).  Leg Venous US - Preliminary report: Bilateral: No evidence of DVT, superficial thrombosis, or Flowers's Cyst.  DVT Prophylaxis  :  Lovenox   Lab Results  Component Value Date   PLT 188 01/05/2016    Inpatient Medications  Scheduled Meds: . cefpodoxime  200 mg Oral Q12H  . colchicine  0.6 mg Oral Daily  . enoxaparin (LOVENOX) injection  40 mg Subcutaneous Q24H  . methylPREDNISolone (SOLU-MEDROL) injection  60 mg Intravenous Q24H   Continuous Infusions:  PRN Meds:.acetaminophen **OR** [DISCONTINUED] acetaminophen, [DISCONTINUED] ondansetron **OR** ondansetron  (ZOFRAN) IV  Antibiotics  :    Anti-infectives    Start     Dose/Rate Route Frequency Ordered Stop   01/06/16 1000  cefpodoxime (VANTIN) tablet 200 mg     200 mg Oral Every 12 hours 01/06/16 0907     01/05/16 0600  ceFAZolin (ANCEF) IVPB 1 g/50 mL premix  Status:  Discontinued     1 g 100 mL/hr over 30 Minutes Intravenous Every 8 hours 01/05/16 0046 01/06/16 0907   01/04/16 2145  ceFAZolin (ANCEF) IVPB 1 g/50 mL premix     1 g 100 mL/hr over 30 Minutes Intravenous  Once 01/04/16 2139 01/04/16 2229         Objective:   Filed Vitals:   01/05/16 2148 01/06/16 0521 01/06/16 1500 01/06/16 2048  BP: 135/54 133/74 102/51 116/50  Pulse: 94 74 79 80  Temp: 99.2 F (37.3 C) 97.8 F (36.6 C) 98.2 F (36.8 C) 97.8 F (36.6 C)  TempSrc: Oral Oral Oral Axillary  Resp: 16 18 16 18   Height:      Weight:      SpO2: 99% 99% 100% 97%    Wt Readings from Last 3 Encounters:  01/05/16 52.2 kg (115 lb 1.3 oz)  07/26/09 45.224 kg (99 lb 11.2 oz)  07/21/08 39.576 kg (87 lb 4 oz)     Intake/Output Summary (Last 24 hours) at 01/07/16 0953 Last data filed at 01/06/16 1719  Gross per 24 hour  Intake    240 ml  Output      0 ml  Net    240 ml     Physical Exam  Awake But pleasantly confused, No new F.N deficits, Normal affect [place].AT,PERRAL Supple Neck,No JVD, No cervical lymphadenopathy appriciated.  Symmetrical Chest wall movement, Good air movement bilaterally, CTAB RRR,No Gallops,Rubs or new Murmurs, No Parasternal Heave +ve B.Sounds, Abd Soft, No tenderness, No organomegaly appriciated, No rebound - guarding or rigidity.  No Cyanosis, Clubbing, No new Rash or bruise, Mild 1+ edema with some erythema and warmth in both lower extremities    01-05-16 picture     Data Review:    CBC  Recent Labs Lab 01/04/16 2014 01/05/16 0528  WBC 1.5* 1.9*  HGB 13.2 12.8  HCT 40.0 38.9  PLT 185 188  MCV 95.0 93.5  MCH 31.4 30.8  MCHC 33.0 32.9  RDW 13.0 12.7  LYMPHSABS 0.5*  --     MONOABS 0.2  --   EOSABS 0.1  --   BASOSABS 0.0  --     Chemistries   Recent Labs Lab 01/04/16 2014 01/05/16 0528 01/06/16 0505  NA 138 138 136  K 3.9 3.4* 4.8  CL 104 102 98*  CO2 27 30 31   GLUCOSE 103* 101* 107*  BUN 23* 19 21*  CREATININE 0.71 0.76 0.89  CALCIUM 9.1 8.9 9.3  MG  --  2.1  --   AST 35 29  --   ALT 28 27  --   ALKPHOS 132* 132*  --   BILITOT 1.2 1.2  --    ------------------------------------------------------------------------------------------------------------------ No results for input(s): CHOL, HDL, LDLCALC, TRIG, CHOLHDL, LDLDIRECT in the last 72 hours.  Lab Results  Component Value Date   HGBA1C 4.9 01/05/2016   ------------------------------------------------------------------------------------------------------------------  Recent Labs  01/05/16 0528  TSH 4.933*   ------------------------------------------------------------------------------------------------------------------ No results for input(s): VITAMINB12, FOLATE, FERRITIN, TIBC, IRON, RETICCTPCT in the last 72 hours.  Coagulation profile No results for input(s): INR, PROTIME in the last 168 hours.   Recent Labs  01/04/16 2134  DDIMER 1.08*    Cardiac Enzymes No results for input(s): CKMB, TROPONINI, MYOGLOBIN in the last 168 hours.  Invalid input(s): CK ------------------------------------------------------------------------------------------------------------------    Component Value Date/Time   BNP 30.5 01/04/2016 2015    Micro Results Recent Results (from the past 240 hour(s))  Urine culture     Status: Abnormal   Collection Time: 01/04/16 11:29 PM  Result Value Ref Range Status   Specimen Description URINE, CLEAN CATCH  Final   Special Requests Immunocompromised  Final   Culture >=100,000 COLONIES/mL ESCHERICHIA COLI (A)  Final   Report Status 01/07/2016 FINAL  Final   Organism ID, Bacteria ESCHERICHIA COLI (A)  Final      Susceptibility   Escherichia  coli - MIC*    AMPICILLIN <=2 SENSITIVE Sensitive     CEFAZOLIN <=4 SENSITIVE Sensitive     CEFTRIAXONE <=1 SENSITIVE Sensitive     CIPROFLOXACIN <=0.25 SENSITIVE Sensitive     GENTAMICIN <=1 SENSITIVE Sensitive     IMIPENEM 0.5 SENSITIVE Sensitive     NITROFURANTOIN <=16 SENSITIVE Sensitive     TRIMETH/SULFA <=20 SENSITIVE Sensitive     AMPICILLIN/SULBACTAM <=2 SENSITIVE Sensitive     PIP/TAZO <=4 SENSITIVE Sensitive     * >=100,000 COLONIES/mL ESCHERICHIA COLI    Radiology Reports Dg Chest 2 View  01/04/2016  CLINICAL DATA:  Bilateral lower extremity edema for 1 month. EXAM: CHEST  2 VIEW COMPARISON:  04/03/2012 FINDINGS: The lungs are clear except for mild unchanged linear scarring in the left base. There is no pleural effusion. Hilar and mediastinal contours are unremarkable and unchanged. Pulmonary vasculature is normal. IMPRESSION: No active cardiopulmonary disease. Electronically Signed   By: Andreas Newport M.D.   On: 01/04/2016 21:07   Dg Ankle Complete Left  01/07/2016  CLINICAL DATA:  Ankle pain EXAM: LEFT ANKLE COMPLETE - 3+ VIEW COMPARISON:  None. FINDINGS: Generalized osteopenia is identified.  No gross soft tissue abnormality is seen. No definitive fracture is noted. IMPRESSION: Generalized osteopenia without acute abnormality. Electronically Signed   By: Inez Catalina M.D.   On: 01/07/2016 09:24   Dg Foot Complete Left  01/07/2016  CLINICAL DATA:  Left foot pain EXAM: LEFT FOOT - COMPLETE 3+ VIEW COMPARISON:  06/21/2006 FINDINGS: Generalized osteopenia is noted. Fine bony detail is somewhat limited by extrinsic dressings. No definitive fracture is seen. No gross soft tissue abnormality is noted. IMPRESSION: Generalized osteopenia without acute abnormality. Electronically Signed   By: Inez Catalina M.D.   On: 01/07/2016 09:20    Time Spent in minutes  30   Ovetta Bazzano K M.D on 01/07/2016 at 9:53 AM  Between 7am to 7pm - Pager - [phone]  After 7pm go to [email or web address]  - password [facility]  [facility] -  Office  [phone]

## 2016-01-07 NOTE — Plan of Care (Signed)
Problem: Physical Regulation: Goal: Will remain free from infection Outcome: Progressing .  Problem: Skin Integrity: Goal: Risk for impaired skin integrity will decrease Continue.    Problem: Activity: Goal: Risk for activity intolerance will decrease Outcome: Progressing Pt states she has been "a cripple" all of her life due to [place].  Complains of pain to left foot> right foot.  MD ordered xray (neg) and started on steroid and colchicine.    Problem: Nutrition: Goal: Adequate nutrition will be maintained Outcome: Progressing .

## 2016-01-07 NOTE — Clinical Social Work Note (Signed)
LCSW spoke with patient's daughter, Rivka Barbara [id number]) regarding discharge planning. LCSW informed patient's daughter of PT recommendation for patient to discharge to SNF to complete short-term rehabilitation. Patient's daughter expressed understanding but stated patient will return home once discharge. Per patient's daughter, patient "does well at home." LCSW notified MD of change in discharge disposition.  LCSW signing off.  Lubertha Sayres, [facility]: [phone] Surgical: [phone]

## 2016-01-07 NOTE — NC FL2 (Signed)
[place] MEDICAID FL2 LEVEL OF CARE SCREENING TOOL     IDENTIFICATION  Patient Name: Jill Flowers Birthdate: 1931-01-29 Sex: female Admission Date (Current Location): 01/04/2016  [facility] and [place] Number:  Herbalist and Address:  [facility],  [place] [address] [place]      Provider Number: [id number]  Attending Physician Name and Address:  Thurnell Lose, MD  Relative Name and Phone Number:       Current Level of Care: Hospital Recommended Level of Care: [place] Prior Approval Number:    Date Approved/Denied:   PASRR Number: [id number]  Discharge Plan: SNF    Current Diagnoses: Patient Active Problem List   Diagnosis Date Noted  . Pressure ulcer 01/05/2016  . COPD (chronic obstructive pulmonary disease) ([place]) 01/04/2016  . Cellulitis 01/04/2016  . Leg swelling 01/04/2016  . Bilateral leg edema 01/04/2016  . Lymphoma ([place])   . History of oral candidiasis 09/28/2010  . PNEUMONIA, COMMUNITY ACQUIRED, PNEUMOCOCCAL 07/26/2009  . PULMONARY NODULE, LEFT UPPER LOBE 07/26/2009  . MALNUTRITION, PROTEIN-CALORIE 07/21/2008  . ACUTE BRONCHITIS 07/21/2008  . COPD 05/06/2008  . LOSS OF WEIGHT 04/12/2008  . URI 03/07/2008  . HERPES SIMPLEX, UNCOMPLICATED [id number]  . LYMPHOMA 01/21/2007  . FIBROIDS, UTERUS 01/21/2007  . HYPERLIPIDEMIA 01/21/2007  . GERD 01/21/2007  . OVERACTIVE BLADDER 01/21/2007  . DEGENERATIVE JOINT DISEASE 01/21/2007  . SPINA BIFIDA 01/21/2007  . LEG EDEMA, BILATERAL 01/21/2007  . PELVIC MASS 01/21/2007  . HYSTERECTOMY, HX OF 01/21/2007    Orientation RESPIRATION BLADDER Height & Weight     Self  Normal Incontinent Weight: 115 lb 1.3 oz (52.2 kg) Height:  5\' 6"  (167.6 cm)  BEHAVIORAL SYMPTOMS/MOOD NEUROLOGICAL BOWEL NUTRITION STATUS      Continent Diet (Please see discharge summary.)  AMBULATORY STATUS COMMUNICATION OF NEEDS Skin    (Did not ambulate with PT) Verbally PU Stage and  Appropriate Care PU Stage 1 Dressing: No Dressing                     Personal Care Assistance Level of Assistance  Bathing, Feeding, Dressing Bathing Assistance: Limited assistance Feeding assistance: Limited assistance Dressing Assistance: Limited assistance     Functional Limitations Info             [place]  PT (By licensed PT), OT (By licensed OT)     PT Frequency: 5 OT Frequency: 5            Contractures      Additional Factors Info  Code Status, Allergies Code Status Info: DNR Allergies Info: Dilaudid, Hydrocodone-acetaminophen, Penicillins, Tramadol           Current Medications (01/07/2016):  This is the current hospital active medication list Current Facility-Administered Medications  Medication Dose Route Frequency Provider Last Rate Last Dose  . acetaminophen (TYLENOL) tablet 650 mg  650 mg Oral Q6H PRN Toy Baker, MD   650 mg at 01/06/16 2225  . cefpodoxime (VANTIN) tablet 200 mg  200 mg Oral Q12H Thurnell Lose, MD   200 mg at 01/07/16 0928  . enoxaparin (LOVENOX) injection 40 mg  40 mg Subcutaneous Q24H Toy Baker, MD   40 mg at 01/07/16 0928  . ondansetron (ZOFRAN) injection 4 mg  4 mg Intravenous Q6H PRN Toy Baker, MD         Discharge Medications: Please see discharge summary for a list of discharge medications.  Relevant Imaging Results:  Relevant Lab Results:   Additional Information SSN: [id number]  Caroline Sauger, LCSW

## 2016-01-08 DIAGNOSIS — M7989 Other specified soft tissue disorders: Secondary | ICD-10-CM | POA: Diagnosis not present

## 2016-01-08 DIAGNOSIS — J410 Simple chronic bronchitis: Secondary | ICD-10-CM | POA: Diagnosis not present

## 2016-01-08 DIAGNOSIS — R600 Localized edema: Secondary | ICD-10-CM | POA: Diagnosis not present

## 2016-01-08 MED ORDER — PREDNISONE 5 MG PO TABS: ORAL_TABLET | ORAL | Status: DC

## 2016-01-08 MED ORDER — CEPHALEXIN 500 MG PO CAPS: 500.0000 mg | ORAL_CAPSULE | Freq: Three times a day (TID) | ORAL | Status: DC

## 2016-01-08 MED ORDER — FUROSEMIDE 20 MG PO TABS: 20.0000 mg | ORAL_TABLET | Freq: Every day | ORAL | Status: DC

## 2016-01-08 MED ORDER — COLCHICINE 0.6 MG PO TABS: 0.6000 mg | ORAL_TABLET | Freq: Every day | ORAL | Status: DC

## 2016-01-08 NOTE — Plan of Care (Signed)
Problem: Safety: Goal: Ability to remain free from injury will improve Outcome: Completed/Met Date Met:  01/08/16 Patient has been out the bed 2 assist and walker use. Safety education provided to daughter at bedside

## 2016-01-08 NOTE — Progress Notes (Addendum)
Patient had some difficulty with ambulation this am. Gilford Rile used, 2 assist.  Pt denied pain and or discomfort. Pt is unable to follow verbal cues to assist with ambulation. Daughter is at bedside and aware. SNF was recommended, family prefers pt return home. Daughter stated that pt will do better once she is not wearing grip socks. Safety precautions and prevention discussed.

## 2016-01-08 NOTE — Progress Notes (Signed)
Spoke with pt's daughter who states, "No to transportation, I will take her home. She has a walker at home. No, HH I take care of her."

## 2016-01-08 NOTE — Progress Notes (Signed)
Discharge instruction reviewed , daughter denied questions and concerns r/t instruction. daughter preferred to schedule f/u visit. Pt at baseline at dc

## 2016-01-08 NOTE — Plan of Care (Signed)
Problem: Pain Managment: Goal: General experience of comfort will improve Outcome: Completed/Met Date Met:  01/08/16 Pt denies pain at this time

## 2016-01-08 NOTE — Plan of Care (Signed)
Problem: Activity: Goal: Risk for activity intolerance will decrease Outcome: Adequate for Discharge Patient will need assistance walker out of bed. Daughter is aware, education provided

## 2016-01-08 NOTE — Discharge Instructions (Signed)
Follow with Primary MD  in 7 days   Get CBC, CMP, 2 view Chest X ray checked  by Primary MD or SNF MD in 5-7 days ( we routinely change or add medications that can affect your baseline labs and fluid status, therefore we recommend that you get the mentioned basic workup next visit with your PCP, your PCP may decide not to get them or add new tests based on their clinical decision)   Activity: As tolerated with Full fall precautions use walker/cane & assistance as needed   Disposition Home     Diet:   Heart Healthy  with feeding assistance and aspiration precautions.  For Heart failure patients - Check your Weight same time everyday, if you gain over 2 pounds, or you develop in leg swelling, experience more shortness of breath or chest pain, call your Primary MD immediately. Follow Cardiac Low Salt Diet and 1.5 lit/day fluid restriction.   On your next visit with your primary care physician please Get Medicines reviewed and adjusted.   Please request your Prim.MD to go over all Hospital Tests and Procedure/Radiological results at the follow up, please get all Hospital records sent to your Prim MD by signing hospital release before you go home.   If you experience worsening of your admission symptoms, develop shortness of breath, life threatening emergency, suicidal or homicidal thoughts you must seek medical attention immediately by calling 911 or calling your MD immediately  if symptoms less severe.  You Must read complete instructions/literature along with all the possible adverse reactions/side effects for all the Medicines you take and that have been prescribed to you. Take any new Medicines after you have completely understood and accpet all the possible adverse reactions/side effects.   Do not drive, operate heavy machinery, perform activities at heights, swimming or participation in water activities or provide baby sitting services if your were admitted for syncope or siezures until  you have seen by Primary MD or a Neurologist and advised to do so again.  Do not drive when taking Pain medications.    Do not take more than prescribed Pain, Sleep and Anxiety Medications  Special Instructions: If you have smoked or chewed Tobacco  in the last 2 yrs please stop smoking, stop any regular Alcohol  and or any Recreational drug use.  Wear Seat belts while driving.   Please note  You were cared for by a hospitalist during your hospital stay. If you have any questions about your discharge medications or the care you received while you were in the hospital after you are discharged, you can call the unit and asked to speak with the hospitalist on call if the hospitalist that took care of you is not available. Once you are discharged, your primary care physician will handle any further medical issues. Please note that NO REFILLS for any discharge medications will be authorized once you are discharged, as it is imperative that you return to your primary care physician (or establish a relationship with a primary care physician if you do not have one) for your aftercare needs so that they can reassess your need for medications and monitor your lab values.

## 2016-01-08 NOTE — Discharge Summary (Signed)
Jill Flowers [id number] DOB: [birth year] DOA: 01/04/2016  PCP: No primary care provider on file.  Admit date: 01/04/2016  Discharge date: 01/08/2016  Admitted From: Home   Disposition:  Home   Recommendations for Outpatient Follow-up:   Follow up with PCP in 1-2 weeks  PCP Please obtain BMP/CBC in one week, 2 view CXR (see Discharge instructions)   PCP Please follow up on the following pending results: None   Home Health: Home health PT, RN and social worker   Equipment/Devices: Conservation officer, nature  Discharge Condition: Fair  CODE STATUS: DNR Diet Recommendation: Heart Healthy Consultations: None  Chief Complaint  Patient presents with  . Leg Swelling     Brief history of present illness from the day of admission and additional interim summary    Jill Flowers is a 82 y.o. female with medical history significant of chronic neutropenia, hyperlipidemia, Spina bifida, pelvic mass, history of lymphoma in remission was never confirmed history of emphysema, dementia  Presented with history of bilateral pitting edema of the legs for at least one month but have had worsening swelling and redness. Recently she has been getting more short of breath with exertion. Daughter wanted to bring her to the doctor but patient refused to go so her daughter called 911.  The pain in her legs have been so bad that she cannot walk.Her left leg has been more red and swollen. No chest pain, no fever no nausea or vomiting.      Hospital issues addressed    1. Biateral lower extremity edema with some warmth and redness. Appears to be more consistent with chronic edema/venous stasis, lower extremity venous duplex -ve, echo Stable with mild diastolic dysfunction EF [id number],  UA has no proteinuria, liver enzymes stable. Albumin level  stable.   Much better with gentle diuresis and UNNA boots and edema much improved, there was a question of mild cellulitis, placed on Keflex for a few more days was treated empirically with antibiotics here. So far all cultures unremarkable.   2. Dementia. Poor quality of life at home, discussed with daughter, at risk for delirium, supportive care, minimize benzodiazepines and narcotics. She qualified for SNF but patient and family refused placement.  3. History of low-grade lymphoma with leukopenia. Follows with Dr. Jana Hakim annually, supportive care only.  4. COPD. Stable at baseline no acute issues.  5. Mild acute on chronic diastolic CHF EF [id number]. Diuresed and stable, placed on low-dose Lasix upon discharge request PCP to monitor BMP closely.  6. L foot pain due to gout - x-ray stable, placed on colchicine and the son with improvement continue prednisone taper upon discharge and daily colchicine, uric acid was elevated at 6.8.   Discharge diagnosis     Active Problems:   COPD (chronic obstructive pulmonary disease) (HCC)   Cellulitis   Leg swelling   Bilateral leg edema   Pressure ulcer    Discharge instructions        Discharge Instructions    Diet - low sodium heart healthy  Complete by:  As directed      Discharge instructions    Complete by:  As directed   Follow with Primary MD  in 7 days   Get CBC, CMP, 2 view Chest X ray checked  by Primary MD or SNF MD in 5-7 days ( we routinely change or add medications that can affect your baseline labs and fluid status, therefore we recommend that you get the mentioned basic workup next visit with your PCP, your PCP may decide not to get them or add new tests based on their clinical decision)   Activity: As tolerated with Full fall precautions use walker/cane & assistance as needed   Disposition Home     Diet:   Heart Healthy  with feeding assistance and aspiration precautions.  For Heart failure patients - Check your  Weight same time everyday, if you gain over 2 pounds, or you develop in leg swelling, experience more shortness of breath or chest pain, call your Primary MD immediately. Follow Cardiac Low Salt Diet and 1.5 lit/day fluid restriction.   On your next visit with your primary care physician please Get Medicines reviewed and adjusted.   Please request your Prim.MD to go over all Hospital Tests and Procedure/Radiological results at the follow up, please get all Hospital records sent to your Prim MD by signing hospital release before you go home.   If you experience worsening of your admission symptoms, develop shortness of breath, life threatening emergency, suicidal or homicidal thoughts you must seek medical attention immediately by calling 911 or calling your MD immediately  if symptoms less severe.  You Must read complete instructions/literature along with all the possible adverse reactions/side effects for all the Medicines you take and that have been prescribed to you. Take any new Medicines after you have completely understood and accpet all the possible adverse reactions/side effects.   Do not drive, operate heavy machinery, perform activities at heights, swimming or participation in water activities or provide baby sitting services if your were admitted for syncope or siezures until you have seen by Primary MD or a Neurologist and advised to do so again.  Do not drive when taking Pain medications.    Do not take more than prescribed Pain, Sleep and Anxiety Medications  Special Instructions: If you have smoked or chewed Tobacco  in the last 2 yrs please stop smoking, stop any regular Alcohol  and or any Recreational drug use.  Wear Seat belts while driving.   Please note  You were cared for by a hospitalist during your hospital stay. If you have any questions about your discharge medications or the care you received while you were in the hospital after you are discharged, you can call  the unit and asked to speak with the hospitalist on call if the hospitalist that took care of you is not available. Once you are discharged, your primary care physician will handle any further medical issues. Please note that NO REFILLS for any discharge medications will be authorized once you are discharged, as it is imperative that you return to your primary care physician (or establish a relationship with a primary care physician if you do not have one) for your aftercare needs so that they can reassess your need for medications and monitor your lab values.     Increase activity slowly    Complete by:  As directed            Discharge Medications     Medication List  TAKE these medications        cephALEXin 500 MG capsule  Commonly known as:  KEFLEX  Take 1 capsule (500 mg total) by mouth 3 (three) times daily.     colchicine 0.6 MG tablet  Take 1 tablet (0.6 mg total) by mouth daily.     furosemide 20 MG tablet  Commonly known as:  LASIX  Take 1 tablet (20 mg total) by mouth daily.     predniSONE 5 MG tablet  Commonly known as:  DELTASONE  Label  & dispense according to the schedule below. 10 Pills PO for 3 days then, 8 Pills PO for 3 days, 6 Pills PO for 3 days, 4 Pills PO for 3 days, 2 Pills PO for 3 days, 1 Pills PO for 3 days, 1/2 Pill  PO for 3 days then STOP. Total 95 pills.        Allergies  Allergen Reactions  . Dilaudid [Hydromorphone Hcl]   . Hydrocodone-Acetaminophen     REACTION: N \\T \ V  . Penicillins     REACTION: swelling Has patient had a PCN reaction causing immediate rash, facial/tongue/throat swelling, SOB or lightheadedness with hypotension: Yes Has patient had a PCN reaction causing severe rash involving mucus membranes or skin necrosis: No Has patient had a PCN reaction that required hospitalization No Has patient had a PCN reaction occurring within the last 10 years: No If all of the above answers are "NO", then may proceed with Cephalosporin  use.   . Tramadol     Follow-up Information    Follow up with [facility]. Schedule an appointment as soon as possible for a visit in 3 days.   Why:  or your PCP within 1 week   Contact information:   [place] [phone]       Major procedures and Radiology Reports - PLEASE review detailed and final reports for all details, in brief -   TTE Left ventricle: The cavity size was normal. Wall thickness was normal. Systolic function was vigorous. The estimated ejection fraction was in the range of 65% to 70%. Wall motion was normal; there were no regional wall motion abnormalities. Doppler parameters are consistent with abnormal left ventricular relaxation (grade 1 diastolic dysfunction).  Leg Venous US - Preliminary report: Bilateral: No evidence of DVT, superficial thrombosis, or Baker's Cyst.   Dg Chest 2 View  01/04/2016  CLINICAL DATA:  Bilateral lower extremity edema for 1 month. EXAM: CHEST  2 VIEW COMPARISON:  04/03/2012 FINDINGS: The lungs are clear except for mild unchanged linear scarring in the left base. There is no pleural effusion. Hilar and mediastinal contours are unremarkable and unchanged. Pulmonary vasculature is normal. IMPRESSION: No active cardiopulmonary disease. Electronically Signed   By: Andreas Newport M.D.   On: 01/04/2016 21:07   Dg Ankle Complete Left  01/07/2016  CLINICAL DATA:  Ankle pain EXAM: LEFT ANKLE COMPLETE - 3+ VIEW COMPARISON:  None. FINDINGS: Generalized osteopenia is identified. No gross soft tissue abnormality is seen. No definitive fracture is noted. IMPRESSION: Generalized osteopenia without acute abnormality. Electronically Signed   By: Inez Catalina M.D.   On: 01/07/2016 09:24   Dg Foot Complete Left  01/07/2016  CLINICAL DATA:  Left foot pain EXAM: LEFT FOOT - COMPLETE 3+ VIEW COMPARISON:  06/21/2006 FINDINGS: Generalized osteopenia is noted. Fine bony detail is  somewhat limited by extrinsic dressings. No definitive fracture is seen. No gross soft tissue abnormality  is noted. IMPRESSION: Generalized osteopenia without acute abnormality. Electronically Signed   By: Inez Catalina M.D.   On: 01/07/2016 09:20    Micro Results      Recent Results (from the past 240 hour(s))  Urine culture     Status: Abnormal   Collection Time: 01/04/16 11:29 PM  Result Value Ref Range Status   Specimen Description URINE, CLEAN CATCH  Final   Special Requests Immunocompromised  Final   Culture >=100,000 COLONIES/mL ESCHERICHIA COLI (A)  Final   Report Status 01/07/2016 FINAL  Final   Organism ID, Bacteria ESCHERICHIA COLI (A)  Final      Susceptibility   Escherichia coli - MIC*    AMPICILLIN <=2 SENSITIVE Sensitive     CEFAZOLIN <=4 SENSITIVE Sensitive     CEFTRIAXONE <=1 SENSITIVE Sensitive     CIPROFLOXACIN <=0.25 SENSITIVE Sensitive     GENTAMICIN <=1 SENSITIVE Sensitive     IMIPENEM 0.5 SENSITIVE Sensitive     NITROFURANTOIN <=16 SENSITIVE Sensitive     TRIMETH/SULFA <=20 SENSITIVE Sensitive     AMPICILLIN/SULBACTAM <=2 SENSITIVE Sensitive     PIP/TAZO <=4 SENSITIVE Sensitive     * >=100,000 COLONIES/mL ESCHERICHIA COLI    Today   Subjective    Jill Flowers today has no headache,no chest abdominal pain,no new weakness tingling or numbness, feels much better wants to go home today. Improved left foot pain   Objective   Blood pressure 117/62, pulse 84, temperature 98.1 F (36.7 C), temperature source Oral, resp. rate 20, height 5\' 6"  (1.676 m), weight 52.2 kg (115 lb 1.3 oz), SpO2 98 %.   Intake/Output Summary (Last 24 hours) at 01/08/16 0920 Last data filed at 01/08/16 0910  Gross per 24 hour  Intake    240 ml  Output    200 ml  Net     40 ml    Exam Awake, Pleasantly confused, No new F.N deficits, Normal affect [place].AT,PERRAL Supple Neck,No JVD, No cervical lymphadenopathy appriciated.  Symmetrical Chest wall movement, Good air movement  bilaterally, CTAB RRR,No Gallops,Rubs or new Murmurs, No Parasternal Heave +ve B.Sounds, Abd Soft, Non tender, No organomegaly appriciated, No rebound -guarding or rigidity. No Cyanosis, Clubbing, minimal edema, No new Rash or bruise, Left ankle minimally tender   Data Review   CBC w Diff:  Lab Results  Component Value Date   WBC 1.9* 01/05/2016   WBC 1.6* 04/03/2012   WBC 1.1* 11/26/2010   HGB 12.8 01/05/2016   HGB 13.9 04/03/2012   HGB 12.5 11/26/2010   HCT 38.9 01/05/2016   HCT 44.8 04/03/2012   HCT 37.4 11/26/2010   PLT 188 01/05/2016   PLT 158 11/26/2010   LYMPHOPCT 35 01/04/2016   LYMPHOPCT 54.4* 11/26/2010   MONOPCT 15 01/04/2016   MONOPCT 19.3* 11/26/2010   EOSPCT 5 01/04/2016   EOSPCT 7.0 11/26/2010   BASOPCT 0 01/04/2016   BASOPCT 1.8 11/26/2010    CMP:  Lab Results  Component Value Date   NA 136 01/06/2016   K 4.8 01/06/2016   CL 98* 01/06/2016   CO2 31 01/06/2016   BUN 21* 01/06/2016   CREATININE 0.89 01/06/2016   PROT 7.1 01/05/2016   ALBUMIN 3.7 01/05/2016   BILITOT 1.2 01/05/2016   ALKPHOS 132* 01/05/2016   AST 29 01/05/2016   ALT 27 01/05/2016  .   Total Time in preparing paper work, data evaluation and todays exam - 35 minutes  Thurnell Lose M.D on 01/08/2016 at 9:20 AM  [facility]  Office  [phone]

## 2016-10-06 DIAGNOSIS — L02419 Cutaneous abscess of limb, unspecified: Secondary | ICD-10-CM

## 2016-10-06 DIAGNOSIS — L03119 Cellulitis of unspecified part of limb: Secondary | ICD-10-CM

## 2016-10-06 HISTORY — DX: Cutaneous abscess of limb, unspecified: L03.119

## 2016-10-06 HISTORY — DX: Cutaneous abscess of limb, unspecified: L02.419

## 2016-10-22 ENCOUNTER — Encounter (HOSPITAL_COMMUNITY): Payer: Self-pay | Admitting: Emergency Medicine

## 2016-10-22 ENCOUNTER — Inpatient Hospital Stay (HOSPITAL_COMMUNITY)
Admission: EM | Admit: 2016-10-22 | Discharge: 2016-10-26 | DRG: 603 | Disposition: A | Discharge status: Home / Self Care | Payer: Medicare Other | Attending: Internal Medicine | Admitting: Internal Medicine

## 2016-10-22 DIAGNOSIS — Z23 Encounter for immunization: Secondary | ICD-10-CM

## 2016-10-22 DIAGNOSIS — I5032 Chronic diastolic (congestive) heart failure: Secondary | ICD-10-CM | POA: Diagnosis not present

## 2016-10-22 DIAGNOSIS — R32 Unspecified urinary incontinence: Secondary | ICD-10-CM | POA: Diagnosis present

## 2016-10-22 DIAGNOSIS — I878 Other specified disorders of veins: Secondary | ICD-10-CM | POA: Diagnosis present

## 2016-10-22 DIAGNOSIS — L22 Diaper dermatitis: Secondary | ICD-10-CM | POA: Diagnosis not present

## 2016-10-22 DIAGNOSIS — Z8572 Personal history of non-Hodgkin lymphomas: Secondary | ICD-10-CM

## 2016-10-22 DIAGNOSIS — B029 Zoster without complications: Secondary | ICD-10-CM | POA: Diagnosis present

## 2016-10-22 DIAGNOSIS — L03119 Cellulitis of unspecified part of limb: Secondary | ICD-10-CM | POA: Diagnosis not present

## 2016-10-22 DIAGNOSIS — L03315 Cellulitis of perineum: Secondary | ICD-10-CM | POA: Diagnosis not present

## 2016-10-22 DIAGNOSIS — J449 Chronic obstructive pulmonary disease, unspecified: Secondary | ICD-10-CM | POA: Diagnosis present

## 2016-10-22 DIAGNOSIS — I872 Venous insufficiency (chronic) (peripheral): Secondary | ICD-10-CM | POA: Diagnosis present

## 2016-10-22 DIAGNOSIS — L0390 Cellulitis, unspecified: Secondary | ICD-10-CM | POA: Diagnosis present

## 2016-10-22 DIAGNOSIS — Q059 Spina bifida, unspecified: Secondary | ICD-10-CM

## 2016-10-22 DIAGNOSIS — Z79899 Other long term (current) drug therapy: Secondary | ICD-10-CM

## 2016-10-22 DIAGNOSIS — Z880 Allergy status to penicillin: Secondary | ICD-10-CM

## 2016-10-22 DIAGNOSIS — L03116 Cellulitis of left lower limb: Secondary | ICD-10-CM

## 2016-10-22 DIAGNOSIS — D72819 Decreased white blood cell count, unspecified: Secondary | ICD-10-CM | POA: Diagnosis present

## 2016-10-22 DIAGNOSIS — L988 Other specified disorders of the skin and subcutaneous tissue: Secondary | ICD-10-CM | POA: Diagnosis present

## 2016-10-22 DIAGNOSIS — L03115 Cellulitis of right lower limb: Secondary | ICD-10-CM

## 2016-10-22 DIAGNOSIS — L03818 Cellulitis of other sites: Secondary | ICD-10-CM | POA: Diagnosis not present

## 2016-10-22 DIAGNOSIS — L89151 Pressure ulcer of sacral region, stage 1: Secondary | ICD-10-CM | POA: Diagnosis present

## 2016-10-22 DIAGNOSIS — L0292 Furuncle, unspecified: Secondary | ICD-10-CM | POA: Diagnosis present

## 2016-10-22 DIAGNOSIS — F0390 Unspecified dementia without behavioral disturbance: Secondary | ICD-10-CM | POA: Diagnosis present

## 2016-10-22 DIAGNOSIS — D649 Anemia, unspecified: Secondary | ICD-10-CM | POA: Diagnosis present

## 2016-10-22 DIAGNOSIS — R269 Unspecified abnormalities of gait and mobility: Secondary | ICD-10-CM | POA: Diagnosis present

## 2016-10-22 DIAGNOSIS — C8590 Non-Hodgkin lymphoma, unspecified, unspecified site: Secondary | ICD-10-CM | POA: Diagnosis present

## 2016-10-22 DIAGNOSIS — Z66 Do not resuscitate: Secondary | ICD-10-CM | POA: Diagnosis present

## 2016-10-22 HISTORY — DX: Cutaneous abscess of limb, unspecified: L02.419

## 2016-10-22 HISTORY — DX: Cellulitis of unspecified part of limb: L03.119

## 2016-10-22 HISTORY — DX: Heart failure, unspecified: I50.9

## 2016-10-22 LAB — CBC WITH DIFFERENTIAL/PLATELET
BASOS PCT: 1 %
Basophils Absolute: 0 10*3/uL (ref 0.0–0.1)
EOS PCT: 8 %
Eosinophils Absolute: 0.2 10*3/uL (ref 0.0–0.7)
HEMATOCRIT: 38.6 % (ref 36.0–46.0)
Hemoglobin: 12.5 g/dL (ref 12.0–15.0)
LYMPHS ABS: 0.6 10*3/uL — AB (ref 0.7–4.0)
Lymphocytes Relative: 31 %
MCH: 30.3 pg (ref 26.0–34.0)
MCHC: 32.4 g/dL (ref 30.0–36.0)
MCV: 93.7 fL (ref 78.0–100.0)
MONOS PCT: 15 %
Monocytes Absolute: 0.3 10*3/uL (ref 0.1–1.0)
Neutro Abs: 0.9 10*3/uL — ABNORMAL LOW (ref 1.7–7.7)
Neutrophils Relative %: 45 %
Platelets: 198 10*3/uL (ref 150–400)
RBC: 4.12 MIL/uL (ref 3.87–5.11)
RDW: 13.8 % (ref 11.5–15.5)
WBC: 2 10*3/uL — AB (ref 4.0–10.5)

## 2016-10-22 LAB — COMPREHENSIVE METABOLIC PANEL
ALT: 19 U/L (ref 14–54)
AST: 26 U/L (ref 15–41)
Albumin: 3 g/dL — ABNORMAL LOW (ref 3.5–5.0)
Alkaline Phosphatase: 107 U/L (ref 38–126)
Anion gap: 7 (ref 5–15)
BUN: 26 mg/dL — AB (ref 6–20)
CHLORIDE: 111 mmol/L (ref 101–111)
CO2: 24 mmol/L (ref 22–32)
Calcium: 8.1 mg/dL — ABNORMAL LOW (ref 8.9–10.3)
Creatinine, Ser: 0.73 mg/dL (ref 0.44–1.00)
GFR calc Af Amer: >60 mL/min (ref >60)
Glucose, Bld: 93 mg/dL (ref 65–99)
Potassium: 4.4 mmol/L (ref 3.5–5.1)
SODIUM: 142 mmol/L (ref 135–145)
Total Bilirubin: 0.4 mg/dL (ref 0.3–1.2)
Total Protein: 5.9 g/dL — ABNORMAL LOW (ref 6.5–8.1)

## 2016-10-22 MED ORDER — CEFTRIAXONE SODIUM 1 G IJ SOLR
1.0000 g | Freq: Every day | INTRAMUSCULAR | Status: DC
  Administered 2016-10-22 – 2016-10-24 (×3): 1 g via INTRAVENOUS
  Filled 2016-10-22 (×3): qty 10

## 2016-10-22 MED ORDER — VANCOMYCIN HCL IN DEXTROSE 1-5 GM/200ML-% IV SOLN
1000.0000 mg | Freq: Once | INTRAVENOUS | Status: AC
  Administered 2016-10-22: 1000 mg via INTRAVENOUS
  Filled 2016-10-22: qty 200

## 2016-10-22 MED ORDER — TETANUS-DIPHTH-ACELL PERTUSSIS 5-2.5-18.5 LF-MCG/0.5 IM SUSP
0.5000 mL | Freq: Once | INTRAMUSCULAR | Status: AC
  Administered 2016-10-22: 0.5 mL via INTRAMUSCULAR
  Filled 2016-10-22: qty 0.5

## 2016-10-22 MED ORDER — SODIUM CHLORIDE 0.9 % IV SOLN: 250.0000 mL | INTRAVENOUS | Status: DC | PRN

## 2016-10-22 MED ORDER — ONDANSETRON HCL 4 MG PO TABS: 4.0000 mg | ORAL_TABLET | Freq: Four times a day (QID) | ORAL | Status: DC | PRN

## 2016-10-22 MED ORDER — POLYETHYLENE GLYCOL 3350 17 G PO PACK
17.0000 g | PACK | Freq: Every day | ORAL | Status: DC | PRN
  Administered 2016-10-25: 17 g via ORAL
  Filled 2016-10-22 (×2): qty 1

## 2016-10-22 MED ORDER — ACETAMINOPHEN 650 MG RE SUPP: 650.0000 mg | Freq: Four times a day (QID) | RECTAL | Status: DC | PRN

## 2016-10-22 MED ORDER — SODIUM CHLORIDE 0.9% FLUSH
3.0000 mL | Freq: Two times a day (BID) | INTRAVENOUS | Status: DC
  Administered 2016-10-22 – 2016-10-25 (×6): 3 mL via INTRAVENOUS

## 2016-10-22 MED ORDER — ACETAMINOPHEN 325 MG PO TABS: 650.0000 mg | ORAL_TABLET | Freq: Four times a day (QID) | ORAL | Status: DC | PRN

## 2016-10-22 MED ORDER — SODIUM CHLORIDE 0.9% FLUSH: 3.0000 mL | INTRAVENOUS | Status: DC | PRN

## 2016-10-22 MED ORDER — BISACODYL 5 MG PO TBEC
5.0000 mg | DELAYED_RELEASE_TABLET | Freq: Every day | ORAL | Status: DC | PRN
  Administered 2016-10-25: 5 mg via ORAL
  Filled 2016-10-22 (×2): qty 1

## 2016-10-22 MED ORDER — ENOXAPARIN SODIUM 40 MG/0.4ML ~~LOC~~ SOLN
40.0000 mg | Freq: Every day | SUBCUTANEOUS | Status: DC
  Administered 2016-10-23 – 2016-10-25 (×4): 40 mg via SUBCUTANEOUS
  Filled 2016-10-22 (×4): qty 0.4

## 2016-10-22 MED ORDER — ONDANSETRON HCL 4 MG/2ML IJ SOLN: 4.0000 mg | Freq: Four times a day (QID) | INTRAMUSCULAR | Status: DC | PRN

## 2016-10-22 NOTE — H&P (Signed)
History and Physical    Jill Flowers VOH:[id number] DOB: Mar 26, 1931 DOA: 10/22/2016  PCP: Vena Austria, MD   Patient coming from: Home  Chief Complaint: Bilateral leg swelling and redness, rash in diaper area   HPI: Jill Flowers is a 83 y.o. female with medical history significant for dementia, history of lymphoma not on treatment but followed annually by oncology, spina bifida with chronic gait difficulty, chronic diastolic CHF, and chronic venous stasis who presents to the emergency department for evaluation of bilateral lower extremity swelling and redness and rash in her diaper area. Patient has dementia and is unable to contribute much to the history, which is therefore obtained through discussion with ED personnel, the patient's family, and review of the EMR. Patient has reportedly been in her usual state until the insidious development of progressive bilateral lower extremity edema and redness over the past month. Patient typically ambulates with a walker, but over the past 3 weeks or so, has not been able to do this possibly secondary to the leg swelling per family. Family is also noted a worsening rash and the patient's perineum and groin area which they attribute to her incontinence. Patient has not voiced any specific complaints and denies any fevers, chills, pain, or malaise.  ED Course: Upon arrival to the ED, patient is found to be afebrile, saturating well on room air, and with vital signs stable. Chemistry panels notable for a BUN of 26 with creatinine of 0.73 and CBC is notable for a chronic stable leukopenia with WBC 2000. Blood cultures were obtained, Tdap was updated, and patient was treated with an empiric dose of vancomycin. She remained hemodynamically stable and in no apparent respiratory distress and will be observed on the medical/surgical unit for further evaluation and management of diaper dermatitis with secondary bacterial infection.  Review of Systems:  All  other systems reviewed and apart from HPI, are negative.  Past Medical History:  Diagnosis Date  . Lymphoma ([place])    "mild" per daughter  . Lymphoma ([place])    "mild" per daughter  . Spina bifida [facility])     Past Surgical History:  Procedure Laterality Date  . ABDOMINAL HYSTERECTOMY    . spinda bifida     when she was 83 yo     reports that she has never smoked. She does not have any smokeless tobacco history on file. She reports that she does not use drugs. Her alcohol history is not on file.  Allergies  Allergen Reactions  . Dilaudid [Hydromorphone Hcl]   . Hydrocodone-Acetaminophen     REACTION: N \\T \ V  . Penicillins     REACTION: swelling Has patient had a PCN reaction causing immediate rash, facial/tongue/throat swelling, SOB or lightheadedness with hypotension: Yes Has patient had a PCN reaction causing severe rash involving mucus membranes or skin necrosis: No Has patient had a PCN reaction that required hospitalization No Has patient had a PCN reaction occurring within the last 10 years: No If all of the above answers are "NO", then may proceed with Cephalosporin use.   . Tramadol     Family History  Problem Relation Age of Onset  . Cancer Father   . Cancer Sister   . Stroke Brother      Prior to Admission medications   Medication Sig Start Date End Date Taking? Authorizing Provider  cephALEXin (KEFLEX) 500 MG capsule Take 1 capsule (500 mg total) by mouth 3 (three) times daily. 01/08/16   Thurnell Lose, MD  colchicine 0.6 MG tablet Take 1 tablet (0.6 mg total) by mouth daily. 01/08/16   Thurnell Lose, MD  furosemide (LASIX) 20 MG tablet Take 1 tablet (20 mg total) by mouth daily. 01/08/16   Thurnell Lose, MD  predniSONE (DELTASONE) 5 MG tablet Label  & dispense according to the schedule below. 10 Pills PO for 3 days then, 8 Pills PO for 3 days, 6 Pills PO for 3 days, 4 Pills PO for 3 days, 2 Pills PO for 3 days, 1 Pills PO for 3 days, 1/2 Pill  PO for 3 days  then STOP. Total 95 pills. 01/08/16   Thurnell Lose, MD    Physical Exam: Vitals:   10/22/16 1900 10/22/16 2008 10/22/16 2100 10/22/16 2239  BP: (!) 137/57  124/60 (!) 141/66  Pulse: 76  72 86  Resp: 15  16   Temp:  98.2 F (36.8 C)    TempSrc:  Rectal    SpO2: 100%  100% 100%      Constitutional: NAD, calm, comfortable. Chronically-ill appearing.  Eyes: PERTLA, lids and conjunctivae normal ENMT: Mucous membranes are moist. Posterior pharynx clear of any exudate or lesions.   Neck: normal, supple, no masses, no thyromegaly Respiratory: clear to auscultation bilaterally, no wheezing, no crackles. Normal respiratory effort.   Cardiovascular: S1 & S2 heard, regular rate and rhythm. 2+ pedal and pretibial edema bilaterally. No significant JVD. Abdomen: No distension, no tenderness, no masses palpated. Bowel sounds normal.  Musculoskeletal: no clubbing / cyanosis. No joint deformity upper and lower extremities.    Skin: Erythema to bilateral feet and distal LE's without significant heat and no fluctuance or drainage. Erythema and maceration in diaper region with slight odor and serous drainage. Skin otherwise warm, dry, well-perfused. Neurologic: CN 2-12 grossly intact. Sensation intact, DTR normal. Strength 5/5 in all 4 limbs.  Psychiatric: Alert and oriented to person and place only. Distracted, confused, pulling at lines (baseline per family)     Labs on Admission: I have personally reviewed following labs and imaging studies  CBC:  Recent Labs Lab 10/22/16 2045  WBC 2.0*  NEUTROABS 0.9*  HGB 12.5  HCT 38.6  MCV 93.7  PLT 381   Basic Metabolic Panel:  Recent Labs Lab 10/22/16 2045  NA 142  K 4.4  CL 111  CO2 24  GLUCOSE 93  BUN 26*  CREATININE 0.73  CALCIUM 8.1*   GFR: CrCl cannot be calculated (Unknown ideal weight.). Liver Function Tests:  Recent Labs Lab 10/22/16 2045  AST 26  ALT 19  ALKPHOS 107  BILITOT 0.4  PROT 5.9*  ALBUMIN 3.0*   No  results for input(s): LIPASE, AMYLASE in the last 168 hours. No results for input(s): AMMONIA in the last 168 hours. Coagulation Profile: No results for input(s): INR, PROTIME in the last 168 hours. Cardiac Enzymes: No results for input(s): CKTOTAL, CKMB, CKMBINDEX, TROPONINI in the last 168 hours. BNP (last 3 results) No results for input(s): PROBNP in the last 8760 hours. HbA1C: No results for input(s): HGBA1C in the last 72 hours. CBG: No results for input(s): GLUCAP in the last 168 hours. Lipid Profile: No results for input(s): CHOL, HDL, LDLCALC, TRIG, CHOLHDL, LDLDIRECT in the last 72 hours. Thyroid Function Tests: No results for input(s): TSH, T4TOTAL, FREET4, T3FREE, THYROIDAB in the last 72 hours. Anemia Panel: No results for input(s): VITAMINB12, FOLATE, FERRITIN, TIBC, IRON, RETICCTPCT in the last 72 hours. Urine analysis:    Component Value Date/Time   COLORURINE  YELLOW 01/04/2016 2329   APPEARANCEUR CLOUDY (A) 01/04/2016 2329   LABSPEC 1.024 01/04/2016 2329   LABSPEC 1.015 09/15/2007 1001   PHURINE 6.5 01/04/2016 2329   GLUCOSEU NEGATIVE 01/04/2016 2329   HGBUR NEGATIVE 01/04/2016 2329   BILIRUBINUR NEGATIVE 01/04/2016 2329   BILIRUBINUR Negative 09/15/2007 1001   KETONESUR NEGATIVE 01/04/2016 2329   PROTEINUR NEGATIVE 01/04/2016 2329   UROBILINOGEN 1.0 07/10/2009 0832   NITRITE POSITIVE (A) 01/04/2016 2329   LEUKOCYTESUR NEGATIVE 01/04/2016 2329   LEUKOCYTESUR Negative 09/15/2007 1001   Sepsis Labs: @LABRCNTIP (procalcitonin:4,lacticidven:4) )No results found for this or any previous visit (from the past 240 hour(s)).   Radiological Exams on Admission: No results found.  EKG: Not performed, will obtain as appropriate.  Assessment/Plan  1. Cellulitis  - Pt presents with 3-4 weeks of progressive erythema, and now skin breakdown in diaper region  - Likely a diaper dermatitis in setting of chronic incontinence with a secondary bacterial infection suspected    - Lower legs swollen and red, but appears more consistent with chronic venous stasis  - There is stable chronic leukopenia, but no fever or other systemic s/s of infection  - Blood cultures were collected in ED and she was treated with an empiric dose of vancomycin  - She has documented penicillin allergy but appears to have tolerated cephalosporins previously  - Continue empiric treatment with Rocephin pending cultures and clinical response to treatment    2. Venous stasis dermatitis    - Bilateral LE's with progressive swelling and redness; no vesicles or weeping   - Appears more consistent with venous stasis dermatitis then cellulitis  - Conservative management with elevation of feet, compression stockings, encourage ambulation    3. Chronic leukopenia  - WBC is 2,000 on admission  - This appears to be stable going back years - No fevers; blood cultures were collected in ED, will follow  4. Lymphoma  - Does not appear to an acute issue  - She is not recieving any treatment currently, but is followed annually by oncology    5. Spina bifida    - Chronic and stable  - Ambulates with walker   6. Chronic diastolic CHF - There is bilateral pedal edema, but no JVD, gallop, or crackles  - BUN-Cr ratio elevated, suggesting some intravascular volume depletion  - TTE (01/05/16) with EF 18-56%, grade 1 diastolic dysfunction, no WMA's, and no significant valvular disease  - Follow daily wts and I/O's, allow ad lib PO fluids, SLIV    DVT prophylaxis: sq Lovenox  Code Status: DNR  Family Communication: Daughter updated by phone  Disposition Plan: Observe on med-surg Consults called: None Admission status: Observation   Vianne Bulls, MD [facility] Pager [phone]  If 7PM-7AM, please contact night-coverage [email or web address] Password [facility]  10/22/2016, 10:49 PM

## 2016-10-22 NOTE — ED Notes (Addendum)
Pt's family states pt is non-ambulatory. Family reports pt is unable to get to the bathroom and pt refuses to wear briefs. Pt uses the bathroom on self. Pt comes in to ED unkempt, dirty clothes. Pt has reddened area with blisters to groin, inner thighs and buttocks.

## 2016-10-22 NOTE — ED Provider Notes (Signed)
[place] DEPT Provider Note   CSN: [id number] Arrival date & time: 10/22/16  1804     History   Chief Complaint Chief Complaint  Patient presents with  . Leg Swelling    HPI Jill Flowers is a 83 y.o. female.Complains of bilateral leg swelling and redness for the past 3 weeks redness is also over lying perineum area and she also complains of minimal pain at bilateral legs and perineum. No fever no vomiting. Her daughter reports that she's been unable to use her walker and has been sitting in soiled diapers no other associated symptoms  HPI  Past Medical History:  Diagnosis Date  . Lymphoma ([place])    "mild" per daughter  . Lymphoma ([place])    "mild" per daughter  . Spina bifida [facility])     Patient Active Problem List   Diagnosis Date Noted  . Pressure ulcer 01/05/2016  . COPD (chronic obstructive pulmonary disease) ([place]) 01/04/2016  . Cellulitis 01/04/2016  . Leg swelling 01/04/2016  . Bilateral leg edema 01/04/2016  . Lymphoma ([place])   . History of oral candidiasis 09/28/2010  . PNEUMONIA, COMMUNITY ACQUIRED, PNEUMOCOCCAL 07/26/2009  . PULMONARY NODULE, LEFT UPPER LOBE 07/26/2009  . MALNUTRITION, PROTEIN-CALORIE 07/21/2008  . ACUTE BRONCHITIS 07/21/2008  . COPD 05/06/2008  . LOSS OF WEIGHT 04/12/2008  . URI 03/07/2008  . HERPES SIMPLEX, UNCOMPLICATED 40/98/1191  . LYMPHOMA 01/21/2007  . FIBROIDS, UTERUS 01/21/2007  . HYPERLIPIDEMIA 01/21/2007  . GERD 01/21/2007  . OVERACTIVE BLADDER 01/21/2007  . DEGENERATIVE JOINT DISEASE 01/21/2007  . SPINA BIFIDA 01/21/2007  . LEG EDEMA, BILATERAL 01/21/2007  . PELVIC MASS 01/21/2007  . HYSTERECTOMY, HX OF 01/21/2007    Past Surgical History:  Procedure Laterality Date  . ABDOMINAL HYSTERECTOMY    . spinda bifida     when she was 83 yo    OB History    No data available       Home Medications    Prior to Admission medications   Medication Sig Start Date End Date Taking? Authorizing Provider  cephALEXin  (KEFLEX) 500 MG capsule Take 1 capsule (500 mg total) by mouth 3 (three) times daily. 01/08/16   Thurnell Lose, MD  colchicine 0.6 MG tablet Take 1 tablet (0.6 mg total) by mouth daily. 01/08/16   Thurnell Lose, MD  furosemide (LASIX) 20 MG tablet Take 1 tablet (20 mg total) by mouth daily. 01/08/16   Thurnell Lose, MD  predniSONE (DELTASONE) 5 MG tablet Label  & dispense according to the schedule below. 10 Pills PO for 3 days then, 8 Pills PO for 3 days, 6 Pills PO for 3 days, 4 Pills PO for 3 days, 2 Pills PO for 3 days, 1 Pills PO for 3 days, 1/2 Pill  PO for 3 days then STOP. Total 95 pills. 01/08/16   Thurnell Lose, MD    Family History Family History  Problem Relation Age of Onset  . Cancer Father   . Cancer Sister   . Stroke Brother     Social History Social History  Substance Use Topics  . Smoking status: Never Smoker  . Smokeless tobacco: Not on file  . Alcohol use Not on file     Allergies   Dilaudid [hydromorphone hcl]; Hydrocodone-acetaminophen; Penicillins; and Tramadol   Review of Systems Review of Systems  Constitutional: Negative.   HENT: Negative.   Respiratory: Negative.   Cardiovascular: Positive for leg swelling.  Gastrointestinal: Negative.   Musculoskeletal: Positive for gait problem.  Skin: Negative.   Allergic/Immunologic: Positive for immunocompromised state.  Psychiatric/Behavioral: Negative.   All other systems reviewed and are negative.    Physical Exam Updated Vital Signs BP (!) 142/58 (BP Location: Right Arm)   Pulse 73   Temp 98.2 F (36.8 C) (Rectal)   Resp 18   SpO2 98%   Physical Exam  Constitutional:  Chronically ill-appearing  HENT:  Head: Normocephalic and atraumatic.  Eyes: Conjunctivae are normal. Pupils are equal, round, and reactive to light.  Neck: Neck supple. No tracheal deviation present. No thyromegaly present.  Cardiovascular: Normal rate and regular rhythm.   No murmur heard. Pulmonary/Chest: Effort normal  and breath sounds normal.  Abdominal: Soft. Bowel sounds are normal. She exhibits no distension. There is no tenderness.  Musculoskeletal: Normal range of motion. She exhibits edema. She exhibits no tenderness.  2+ pretibial pitting edema bilaterally  Neurological: She is alert. Coordination normal.  Skin: No rash noted.  Reddened in stocking glove fashion at bilateral feet and distal one third of lower legs with red streaks traveling proximally to the groin and perineum is reddened, nontender with dime-sized open wounds over lying perineum  Psychiatric: She has a normal mood and affect.  Nursing note and vitals reviewed.    ED Treatments / Results  Labs (all labs ordered are listed, but only abnormal results are displayed) Labs Reviewed  CULTURE, BLOOD (ROUTINE X 2)  CULTURE, BLOOD (ROUTINE X 2)  COMPREHENSIVE METABOLIC PANEL  CBC WITH DIFFERENTIAL/PLATELET    EKG  EKG Interpretation None       Radiology No results found.  Procedures Procedures (including critical care time)  Medications Ordered in ED Medications  Tdap (BOOSTRIX) injection 0.5 mL (not administered)  vancomycin (VANCOCIN) IVPB 1000 mg/200 mL premix (not administered)   Results for orders placed or performed during the hospital encounter of 10/22/16  Comprehensive metabolic panel  Result Value Ref Range   Sodium 142 135 - 145 mmol/L   Potassium 4.4 3.5 - 5.1 mmol/L   Chloride 111 101 - 111 mmol/L   CO2 24 22 - 32 mmol/L   Glucose, Bld 93 65 - 99 mg/dL   BUN 26 (H) 6 - 20 mg/dL   Creatinine, Ser 0.73 0.44 - 1.00 mg/dL   Calcium 8.1 (L) 8.9 - 10.3 mg/dL   Total Protein 5.9 (L) 6.5 - 8.1 g/dL   Albumin 3.0 (L) 3.5 - 5.0 g/dL   AST 26 15 - 41 U/L   ALT 19 14 - 54 U/L   Alkaline Phosphatase 107 38 - 126 U/L   Total Bilirubin 0.4 0.3 - 1.2 mg/dL   GFR calc non Af Amer >60 >60 mL/min   GFR calc Af Amer >60 >60 mL/min   Anion gap 7 5 - 15  CBC with Differential/Platelet  Result Value Ref Range    WBC 2.0 (L) 4.0 - 10.5 K/uL   RBC 4.12 3.87 - 5.11 MIL/uL   Hemoglobin 12.5 12.0 - 15.0 g/dL   HCT 38.6 36.0 - 46.0 %   MCV 93.7 78.0 - 100.0 fL   MCH 30.3 26.0 - 34.0 pg   MCHC 32.4 30.0 - 36.0 g/dL   RDW 13.8 11.5 - 15.5 %   Platelets 198 150 - 400 K/uL   Neutrophils Relative % 45 %   Lymphocytes Relative 31 %   Monocytes Relative 15 %   Eosinophils Relative 8 %   Basophils Relative 1 %   Neutro Abs 0.9 (L) 1.7 - 7.7 K/uL  Lymphs Abs 0.6 (L) 0.7 - 4.0 K/uL   Monocytes Absolute 0.3 0.1 - 1.0 K/uL   Eosinophils Absolute 0.2 0.0 - 0.7 K/uL   Basophils Absolute 0.0 0.0 - 0.1 K/uL   WBC Morphology MILD LEFT SHIFT (1-5% METAS, OCC MYELO, OCC BANDS)    No results found.  Initial Impression / Assessment and Plan / ED Course  I have reviewed the triage vital signs and the nursing notes.  Pertinent labs & imaging results that were available during my care of the patient were reviewed by me and considered in my medical decision making (see chart for details).     Leukopenia is chronic In light of a sending with lymphangitis possible immunocompromise state and involvement of perineum open wounds feel the patient requires intravenous antibiotics and local wound care. Intravenous vancomycin ordered I've consulted Dr.Opyd who arranged for overnight stay Final Clinical Impressions(s) / ED Diagnoses  Dx cellulitis  Final diagnoses:  None    New Prescriptions New Prescriptions   No medications on file     Orlie Dakin, MD 10/22/16 2252

## 2016-10-22 NOTE — ED Triage Notes (Addendum)
Per EMS: pt from home c/o bilateral lower leg edema x 1 month with increasing redness and worsening; per family not taking any medications

## 2016-10-22 NOTE — ED Notes (Signed)
Opyd, MD at bedside 

## 2016-10-22 NOTE — ED Notes (Signed)
Pt pulled out IV. Pt pulled off all leads and blood pressure monitoring equipment. Pt reoriented to location. Pt believes she is home and we are intruding in her home. [place] bed alarm put on bed. Door open and visible in front of nurse station.

## 2016-10-23 DIAGNOSIS — L03116 Cellulitis of left lower limb: Secondary | ICD-10-CM | POA: Diagnosis not present

## 2016-10-23 DIAGNOSIS — I872 Venous insufficiency (chronic) (peripheral): Secondary | ICD-10-CM | POA: Diagnosis not present

## 2016-10-23 DIAGNOSIS — B029 Zoster without complications: Secondary | ICD-10-CM | POA: Diagnosis not present

## 2016-10-23 DIAGNOSIS — L03315 Cellulitis of perineum: Secondary | ICD-10-CM | POA: Diagnosis not present

## 2016-10-23 LAB — CBC WITH DIFFERENTIAL/PLATELET
Basophils Absolute: 0 10*3/uL (ref 0.0–0.1)
Basophils Relative: 0 %
EOS PCT: 14 %
Eosinophils Absolute: 0.2 10*3/uL (ref 0.0–0.7)
HCT: 32.2 % — ABNORMAL LOW (ref 36.0–46.0)
Hemoglobin: 10.5 g/dL — ABNORMAL LOW (ref 12.0–15.0)
LYMPHS ABS: 0.6 10*3/uL — AB (ref 0.7–4.0)
Lymphocytes Relative: 55 %
MCH: 30.4 pg (ref 26.0–34.0)
MCHC: 32.6 g/dL (ref 30.0–36.0)
MCV: 93.3 fL (ref 78.0–100.0)
MONOS PCT: 15 %
Monocytes Absolute: 0.2 10*3/uL (ref 0.1–1.0)
NEUTROS ABS: 0.2 10*3/uL — AB (ref 1.7–7.7)
Neutrophils Relative %: 16 %
PLATELETS: 149 10*3/uL — AB (ref 150–400)
RBC: 3.45 MIL/uL — ABNORMAL LOW (ref 3.87–5.11)
RDW: 13.5 % (ref 11.5–15.5)
WBC: 1.2 10*3/uL — CL (ref 4.0–10.5)

## 2016-10-23 LAB — BASIC METABOLIC PANEL
Anion gap: 7 (ref 5–15)
BUN: 20 mg/dL (ref 6–20)
CHLORIDE: 108 mmol/L (ref 101–111)
CO2: 22 mmol/L (ref 22–32)
Calcium: 8.2 mg/dL — ABNORMAL LOW (ref 8.9–10.3)
Creatinine, Ser: 0.71 mg/dL (ref 0.44–1.00)
GFR calc Af Amer: >60 mL/min (ref >60)
GFR calc non Af Amer: >60 mL/min (ref >60)
GLUCOSE: 76 mg/dL (ref 65–99)
POTASSIUM: 3.6 mmol/L (ref 3.5–5.1)
Sodium: 137 mmol/L (ref 135–145)

## 2016-10-23 LAB — PATHOLOGIST SMEAR REVIEW

## 2016-10-23 MED ORDER — ZINC OXIDE 20 % EX OINT
TOPICAL_OINTMENT | Freq: Every day | CUTANEOUS | Status: DC
  Administered 2016-10-23 – 2016-10-24 (×2): via TOPICAL
  Administered 2016-10-25: 1 via TOPICAL
  Administered 2016-10-26: 09:00:00 via TOPICAL
  Filled 2016-10-23: qty 28.35

## 2016-10-23 MED ORDER — ZINC OXIDE 12.8 % EX OINT
TOPICAL_OINTMENT | CUTANEOUS | Status: DC | PRN
  Filled 2016-10-23: qty 56.7

## 2016-10-23 MED ORDER — VALACYCLOVIR HCL 500 MG PO TABS
500.0000 mg | ORAL_TABLET | Freq: Three times a day (TID) | ORAL | Status: DC
  Administered 2016-10-23 – 2016-10-26 (×11): 500 mg via ORAL
  Filled 2016-10-23 (×11): qty 1

## 2016-10-23 MED ORDER — LORAZEPAM 0.5 MG PO TABS
0.5000 mg | ORAL_TABLET | Freq: Every day | ORAL | Status: AC
  Administered 2016-10-23: 0.5 mg via ORAL
  Filled 2016-10-23: qty 1

## 2016-10-23 NOTE — Progress Notes (Addendum)
[facility] PROGRESS NOTE  MERRI DIMAANO TXM:[id number] DOB: [birth year] DOA: 10/22/2016  PCP: Vena Austria, MD  Brief History/Interval Summary: 83 year old Caucasian female with a past medical history of dementia, history of lymphoma, not on treatment, but followed by oncology, history of spina bifida with chronic gait difficulty, chronic diastolic CHF, chronic venous stasis. Presented to the hospital with complaints of leg swelling, redness and rash in the groin area.  Reason for Visit: Cellulitis  Consultants: None  Procedures: None  Antibiotics: Ceftriaxone Valtrex  Subjective/Interval History: Patient somewhat confusing as directed. Her daughter is at the bedside. She states that her leg swelling has been present for many months. Sometimes get worse. Appears to be more red than normal. Also complaints of sharp pain in the groin area. There are lesions in the thigh area have been present for about 3 weeks. There were blisters at that time.  ROS: Denies any nausea or vomiting  Objective:  Vital Signs  Vitals:   10/22/16 2311 10/22/16 2355 10/23/16 0638 10/23/16 1349  BP: (!) 141/66 138/60 (!) 105/42 (!) 114/49  Pulse:  72 67 69  Resp:  19 19 18   Temp:  98.6 F (37 C) 98.4 F (36.9 C) 97.7 F (36.5 C)  TempSrc:  Axillary Oral Oral  SpO2:  100% 99% 100%  Weight:   56.5 kg (124 lb 9 oz)     Intake/Output Summary (Last 24 hours) at 10/23/16 1446 Last data filed at 10/23/16 1350  Gross per 24 hour  Intake              482 ml  Output                0 ml  Net              482 ml   Filed Weights   10/23/16 4825  Weight: 56.5 kg (124 lb 9 oz)    General appearance: alert, cooperative, appears stated age and no distress Head: Normocephalic, without obvious abnormality, atraumatic Resp: clear to auscultation bilaterally Cardio: regular rate and rhythm, S1, S2 normal, no murmur, click, rub or gallop GI: soft, non-tender; bowel sounds normal; no masses,   no organomegaly Extremities: Open shallow wounds noted in the inner thigh area, small in size. No drainage. Tender to palpation. No blisters noted. Erythematous. Slightly warm to touch. Lower legs. Also show evidence for venous stasis dermatitis. Warmth is present. Neurologic: Distracted. No focal neurological deficits.  Lab Results:  Data Reviewed: I have personally reviewed following labs and imaging studies  CBC:  Recent Labs Lab 10/22/16 2045 10/23/16 0533  WBC 2.0* 1.2*  NEUTROABS 0.9* 0.2*  HGB 12.5 10.5*  HCT 38.6 32.2*  MCV 93.7 93.3  PLT 198 149*    Basic Metabolic Panel:  Recent Labs Lab 10/22/16 2045 10/23/16 0533  NA 142 137  K 4.4 3.6  CL 111 108  CO2 24 22  GLUCOSE 93 76  BUN 26* 20  CREATININE 0.73 0.71  CALCIUM 8.1* 8.2*    GFR: CrCl cannot be calculated (Unknown ideal weight.).  Liver Function Tests:  Recent Labs Lab 10/22/16 2045  AST 26  ALT 19  ALKPHOS 107  BILITOT 0.4  PROT 5.9*  ALBUMIN 3.0*     Medications:  Scheduled: . enoxaparin (LOVENOX) injection  40 mg Subcutaneous QHS  . sodium chloride flush  3 mL Intravenous Q12H  . valACYclovir  500 mg Oral TID  . zinc oxide   Topical Daily   Continuous: .  sodium chloride    . cefTRIAXone (ROCEPHIN)  IV 1 g (10/22/16 2310)   UDJ:SHFWYO chloride, acetaminophen **OR** acetaminophen, bisacodyl, ondansetron **OR** ondansetron (ZOFRAN) IV, polyethylene glycol, sodium chloride flush, Zinc Oxide  Assessment/Plan:  Principal Problem:   Cellulitis Active Problems:   Spina bifida ([place])   Lymphoma ([place])   COPD (chronic obstructive pulmonary disease) ([facility])   Chronic diastolic CHF (congestive heart failure) ([facility])   Skin maceration   Chronic venous stasis dermatitis of both lower extremities   Diaper dermatitis    Cellulitis involving the inner thigh, perineum Pt presents with 3-4 weeks of progressive erythema, and now skin breakdown in diaper region. Likely a diaper dermatitis in  setting of chronic incontinence with a secondary bacterial infection suspected. Some of the lesions also suggests herpes zoster. Daughter reports patient has had previous episodes of shingles. However, the distribution makes it less likely. No blister is noted. Some of the pain characteristics are suggestive of neuropathy. Continue ceftriaxone. Add Valtrex. Lower legs swollen and red, but appears more consistent with chronic venous stasis. Follow-up on blood cultures. Keep lower extremities elevated. Informed by wound Rn that some blisters noted as well. Will place on airborne as well.  Venous stasis dermatitis    Bilateral LE's with progressive swelling and redness; no vesicles or weeping. Appears more consistent with venous stasis dermatitis then cellulitis. Conservative management with elevation of feet, compression stockings, encourage ambulation.  Chronic leukopenia  WBC noted to be low. This has been seen previously as well. Continue to monitor for now. Currently afebrile.  Normocytic anemia Hemoglobin noted to be lowered his knee yesterday. No overt bleeding. Continue to monitor.  History of Lymphoma  Does not appear to an acute issue. She is not recieving any treatment currently, but is followed annually by oncology.  History of Spina bifida    Chronic and stable. Ambulates with walker. Initiate PT and OT.  Chronic diastolic CHF No significant edema noted currently. For the most part appears to be euvolemic today. TTE (01/05/16) with EF 37-85%, grade 1 diastolic dysfunction, no WMA's, and no significant valvular disease. Continue to monitor closely.  DVT Prophylaxis: Lovenox    Code Status: DO NOT RESUSCITATE  Family Communication: Discussed with the patient and her daughter  Disposition Plan: Management as outlined above    LOS: 0 days   [facility] Pager [phone] 10/23/2016, 2:46 PM  If 7PM-7AM, please contact night-coverage at [email or web address],  password [facility]

## 2016-10-23 NOTE — Consult Note (Signed)
[place] nurse requested by [facility] for follow up of inner thigh lesions.  Appear to be blister like with some that are open, does not present like MASD (moisture associated skin damage) even though patient has been setting in urine and feces at home.  The presentation is concerning for shingles, and when I discussed with bedside nurses reported MD also is treating like non active shingles with Valtrex.  Some of the lesions are open and with exposure to urine concerned that they may worsen. Will add triple paste to protect open lesions from urine until dry and cleared.  [place], [place]

## 2016-10-23 NOTE — Consult Note (Signed)
[place] Nurse wound consult note Reason for Consult:perineal and buttock rash Wound type: partial thickness, excoriation, pressure Pressure Injury POA: Yes Measurement: Right medial thigh 1cm x 2cm x 0.1 cm, pink wound bed, no drainage or odor noted Left medial buttock 1cm x 1cm  Intact blister that is close to being open Bilateral inner thighs and buttocks have multiple boil type areas, some ruptured, some intact, with excoriated marks from apparent scratching Wound bed:see above Drainage (amount, consistency, odor) see above Periwound:see above Dressing procedure/placement/frequency: Stage I sacral area present on admission covered with foam dressing.  WOC RN to see patient re multiple boil like areas on thighs and buttocks.    Fara Olden, RN-C, WTA-C Wound Treatment Associate

## 2016-10-23 NOTE — Progress Notes (Signed)
CRITICAL VALUE ALERT  Critical value received: WBC 1.2  Date of notification:  10-23-16  Time of notification:  6:50  Critical value read back:Yes  Nurse who received alert:  Janee Morn  MD notified (1st page):  [place] Pink  Time of first page:  7:02  MD notified (2nd page):Krishnan  Time of second page:7:59  Responding MD:  [place] Pink  Time MD responded:  8:05

## 2016-10-23 NOTE — Progress Notes (Signed)
Pt noted to have increased confusion since daughter left bedside.  Pt. Noted to have d/c'd her IV at this time and undressed herself.  RN in to redress pt.  RN contacted Pt's daughter, Jill Flowers and explained pt's situation.  RN further explained that the pt was being moved to room 32. Daughter verbalized understanding and stated that she would not be able to come until this evening to sit with her mother.

## 2016-10-24 ENCOUNTER — Encounter (HOSPITAL_COMMUNITY): Payer: Self-pay | Admitting: General Practice

## 2016-10-24 DIAGNOSIS — L0292 Furuncle, unspecified: Secondary | ICD-10-CM | POA: Diagnosis present

## 2016-10-24 DIAGNOSIS — B029 Zoster without complications: Secondary | ICD-10-CM

## 2016-10-24 DIAGNOSIS — R32 Unspecified urinary incontinence: Secondary | ICD-10-CM | POA: Diagnosis present

## 2016-10-24 DIAGNOSIS — I5032 Chronic diastolic (congestive) heart failure: Secondary | ICD-10-CM | POA: Diagnosis not present

## 2016-10-24 DIAGNOSIS — J449 Chronic obstructive pulmonary disease, unspecified: Secondary | ICD-10-CM | POA: Diagnosis present

## 2016-10-24 DIAGNOSIS — I872 Venous insufficiency (chronic) (peripheral): Secondary | ICD-10-CM | POA: Diagnosis present

## 2016-10-24 DIAGNOSIS — Z8572 Personal history of non-Hodgkin lymphomas: Secondary | ICD-10-CM | POA: Diagnosis not present

## 2016-10-24 DIAGNOSIS — L89151 Pressure ulcer of sacral region, stage 1: Secondary | ICD-10-CM | POA: Diagnosis present

## 2016-10-24 DIAGNOSIS — Q059 Spina bifida, unspecified: Secondary | ICD-10-CM | POA: Diagnosis not present

## 2016-10-24 DIAGNOSIS — L03119 Cellulitis of unspecified part of limb: Secondary | ICD-10-CM | POA: Diagnosis present

## 2016-10-24 DIAGNOSIS — Z880 Allergy status to penicillin: Secondary | ICD-10-CM | POA: Diagnosis not present

## 2016-10-24 DIAGNOSIS — R269 Unspecified abnormalities of gait and mobility: Secondary | ICD-10-CM | POA: Diagnosis present

## 2016-10-24 DIAGNOSIS — L22 Diaper dermatitis: Secondary | ICD-10-CM | POA: Diagnosis present

## 2016-10-24 DIAGNOSIS — D649 Anemia, unspecified: Secondary | ICD-10-CM | POA: Diagnosis present

## 2016-10-24 DIAGNOSIS — L03116 Cellulitis of left lower limb: Secondary | ICD-10-CM | POA: Diagnosis not present

## 2016-10-24 DIAGNOSIS — Z79899 Other long term (current) drug therapy: Secondary | ICD-10-CM | POA: Diagnosis not present

## 2016-10-24 DIAGNOSIS — Z23 Encounter for immunization: Secondary | ICD-10-CM | POA: Diagnosis present

## 2016-10-24 DIAGNOSIS — Z66 Do not resuscitate: Secondary | ICD-10-CM | POA: Diagnosis present

## 2016-10-24 DIAGNOSIS — I878 Other specified disorders of veins: Secondary | ICD-10-CM | POA: Diagnosis present

## 2016-10-24 DIAGNOSIS — D72819 Decreased white blood cell count, unspecified: Secondary | ICD-10-CM | POA: Diagnosis not present

## 2016-10-24 DIAGNOSIS — L03315 Cellulitis of perineum: Secondary | ICD-10-CM | POA: Diagnosis not present

## 2016-10-24 DIAGNOSIS — F0390 Unspecified dementia without behavioral disturbance: Secondary | ICD-10-CM | POA: Diagnosis present

## 2016-10-24 LAB — BASIC METABOLIC PANEL
Anion gap: 7 (ref 5–15)
BUN: 11 mg/dL (ref 6–20)
CALCIUM: 8.6 mg/dL — AB (ref 8.9–10.3)
CO2: 25 mmol/L (ref 22–32)
CREATININE: 0.69 mg/dL (ref 0.44–1.00)
Chloride: 104 mmol/L (ref 101–111)
GFR calc Af Amer: >60 mL/min (ref >60)
Glucose, Bld: 91 mg/dL (ref 65–99)
Potassium: 3.7 mmol/L (ref 3.5–5.1)
SODIUM: 136 mmol/L (ref 135–145)

## 2016-10-24 LAB — CBC WITH DIFFERENTIAL/PLATELET
BASOS PCT: 1 %
Basophils Absolute: 0 10*3/uL (ref 0.0–0.1)
Eosinophils Absolute: 0.2 10*3/uL (ref 0.0–0.7)
Eosinophils Relative: 10 %
HCT: 36 % (ref 36.0–46.0)
Hemoglobin: 11.7 g/dL — ABNORMAL LOW (ref 12.0–15.0)
LYMPHS ABS: 0.7 10*3/uL (ref 0.7–4.0)
Lymphocytes Relative: 43 %
MCH: 30.2 pg (ref 26.0–34.0)
MCHC: 32.5 g/dL (ref 30.0–36.0)
MCV: 92.8 fL (ref 78.0–100.0)
MONO ABS: 0.2 10*3/uL (ref 0.1–1.0)
Monocytes Relative: 15 %
NEUTROS PCT: 31 %
Neutro Abs: 0.5 10*3/uL — ABNORMAL LOW (ref 1.7–7.7)
PLATELETS: 160 10*3/uL (ref 150–400)
RBC: 3.88 MIL/uL (ref 3.87–5.11)
RDW: 13.1 % (ref 11.5–15.5)
WBC: 1.6 10*3/uL — ABNORMAL LOW (ref 4.0–10.5)

## 2016-10-24 MED ORDER — HALOPERIDOL LACTATE 5 MG/ML IJ SOLN
1.0000 mg | Freq: Once | INTRAMUSCULAR | Status: AC
  Administered 2016-10-24: 1 mg via INTRAVENOUS
  Filled 2016-10-24: qty 1

## 2016-10-24 NOTE — Evaluation (Signed)
Occupational Therapy Evaluation Patient Details Name: Jill Flowers MRN: [id number] DOB: 1930/07/11 Today's Date: 10/24/2016    History of Present Illness 83 year old Caucasian female with a past medical history of dementia, history of lymphoma, not on treatment, but followed by oncology, history of spina bifida with chronic gait difficulty, chronic diastolic CHF, chronic venous stasis. Presented to the hospital with complaints of leg swelling, redness and rash in the groin area.   Clinical Impression   Pt admitted with the above diagnoses and presents with below problem list. Pt will benefit from continued acute OT to address the below listed deficits and maximize independence with basic ADLs prior to d/c to next venue. Pt presents with significant acute change in functional level of assist with ADLs. Per daughter's reports pt at baseline ambulates on her own with a walker, someone is with her for stairs, pt sponge bathes at sink without physical assist. Pt currently needing +2 mod-max physical assist with functional mobility/transfers. Pt with impaired B knee flexion impacting transfers and mobility. Recommend SNF for rehab prior to returning home.     Follow Up Recommendations  SNF    Equipment Recommendations  Other (comment) (defer to next venue)    Recommendations for Other Services       Precautions / Restrictions Precautions Precautions: Fall Restrictions Weight Bearing Restrictions: No      Mobility Bed Mobility Overal bed mobility: Needs Assistance Bed Mobility: Rolling;Sidelying to Sit Rolling: Min assist Sidelying to sit: Mod assist;+2 for physical assistance;HOB elevated       General bed mobility comments: difficulty with knee flexion impacting bed mobility  Transfers Overall transfer level: Needs assistance Equipment used: Rolling walker (2 wheeled) Transfers: Sit to/from [facility] Sit to Stand: Mod assist;Max assist;+2 physical  assistance Stand pivot transfers: Mod assist;Max assist;+2 physical assistance       General transfer comment: EOB, BSC, recliner. Mostly mod +2 with some moments of max +2 A. Limited knee flexion impacting transfer.     Balance Overall balance assessment: Needs assistance Sitting-balance support: Bilateral upper extremity supported;Feet supported Sitting balance-Leahy Scale: Fair Sitting balance - Comments: Initial difficulty achieving position of BLE support in seated position.                                    ADL either performed or assessed with clinical judgement   ADL Overall ADL's : Needs assistance/impaired Eating/Feeding: Set up;Sitting   Grooming: Set up;Sitting   Upper Body Bathing: Sitting;Set up;Minimal assistance   Lower Body Bathing: Maximal assistance;Sit to/from stand;+2 for physical assistance;Moderate assistance   Upper Body Dressing : Sitting;Set up;Minimal assistance   Lower Body Dressing: Moderate assistance;Maximal assistance;+2 for physical assistance;Sit to/from stand   Toilet Transfer: Moderate assistance;Maximal assistance;+2 for physical assistance;BSC;RW Toilet Transfer Details (indicate cue type and reason): pivotal steps to [facility]. mod +2 on first attempt, max +2 on second attempt Toileting- Clothing Manipulation and Hygiene: Maximal assistance;+2 for physical assistance;Sit to/from Nurse, children's Details (indicate cue type and reason): sponge bathes at baseline Functional mobility during ADLs: Moderate assistance;Maximal assistance;+2 for physical assistance;Rolling walker General ADL Comments: Pt moving slowly and with increased effort and assist needed compared to baseline. Noted difficulty in B knee flexion impacting transfers and mobility. Trunk flexion and limited knee flexion during OOB noted. Mostly mod A +2 physical A with occasionsional moments of max +2 assist. Very unsteady on feet.  Vision          Perception     Praxis      Pertinent Vitals/Pain Pain Assessment: Faces Faces Pain Scale: Hurts a little bit Pain Location: unspecified Pain Descriptors / Indicators: Sore;Guarding Pain Intervention(s): Limited activity within patient's tolerance;Monitored during session;Repositioned     Hand Dominance     Extremity/Trunk Assessment Upper Extremity Assessment Upper Extremity Assessment: Generalized weakness   Lower Extremity Assessment Lower Extremity Assessment: Defer to PT evaluation       Communication Communication Communication: HOH   Cognition Arousal/Alertness: Awake/alert Behavior During Therapy: WFL for tasks assessed/performed Overall Cognitive Status: History of cognitive impairments - at baseline                                 General Comments: pleasantly confused   General Comments       Exercises     Shoulder Instructions      Home Living Family/patient expects to be discharged to:: Private residence Living Arrangements: Children Available Help at Discharge: Family;Other (Comment) (someone is with  her most of the time) Type of Home: House Home Access: Stairs to enter [employer] of Steps: 3 Entrance Stairs-Rails: Right;Left Home Layout: One level         Biochemist, clinical: Standard     Home Equipment: Wheelchair - [employer] - 4 wheels          Prior Functioning/Environment Level of Independence: Needs assistance  Gait / Transfers Assistance Needed: walker for transfers/mobility; min guard assist with stairs, sponge bathes ADL's / Homemaking Assistance Needed: daughter reports pt  does not need physical assist with bathing/dressing. daughter reports pt sponge bathes at baseline and has someone with her during stairs.            OT Problem List: Decreased strength;Decreased activity tolerance;Impaired balance (sitting and/or standing);Decreased cognition;Decreased safety awareness;Decreased knowledge of  use of DME or AE;Decreased knowledge of precautions;Pain      OT Treatment/Interventions: Self-care/ADL training;Energy conservation;DME and/or AE instruction;Therapeutic activities;Balance training;Patient/family education    OT Goals(Current goals can be found in the care plan section) Acute Rehab OT Goals OT Goal Formulation: With patient Time For Goal Achievement: 11/07/16 Potential to Achieve Goals: Good ADL Goals Pt Will Perform Grooming: standing;with min assist (+2) Pt Will Perform Lower Body Bathing: with min assist;sit to/from stand (+2) Pt Will Perform Lower Body Dressing: with min assist;sit to/from stand (+2) Pt Will Transfer to Toilet: with min assist;with +2 assist;ambulating;bedside commode Pt Will Perform Toileting - Clothing Manipulation and hygiene: with min assist;sit to/from stand (+2) Additional ADL Goal #1: Pt will complete bed mobility at mod A level to prepare for OOB ADLs.   OT Frequency: Min 2X/week   Barriers to D/C: Decreased caregiver support          Co-evaluation PT/OT/SLP Co-Evaluation/Treatment: Yes Reason for Co-Treatment: Necessary to address cognition/behavior during functional activity;For patient/therapist safety;To address functional/ADL transfers          End of Session Equipment Utilized During Treatment: Gait belt;Rolling walker  Activity Tolerance: Patient tolerated treatment well;Patient limited by pain Patient left: in chair;with call bell/phone within reach;with chair alarm set  OT Visit Diagnosis: Unsteadiness on feet (R26.81);Other abnormalities of gait and mobility (R26.89);Muscle weakness (generalized) (M62.81);Other symptoms and signs involving cognitive function;Pain                Time: 1352-1435 OT Time Calculation (min): 43 min Charges:  OT  General Charges $OT Visit: 1 Procedure OT Evaluation $OT Eval Moderate Complexity: 1 Procedure G-Codes: OT G-codes **NOT FOR INPATIENT CLASS** Functional Assessment Tool Used:  AM-PAC 6 Clicks Daily Activity Functional Limitation: Self care Self Care Current Status (M0802): At least 40 percent but less than 60 percent impaired, limited or restricted Self Care Goal Status (M3361): At least 20 percent but less than 40 percent impaired, limited or restricted     Hortencia Pilar 10/24/2016, 3:09 PM

## 2016-10-24 NOTE — Evaluation (Signed)
Physical Therapy Evaluation Patient Details Name: Jill Flowers MRN: [id number] DOB: Jul 28, 1930 Today's Date: 10/24/2016   History of Present Illness  83 year old Caucasian female with a past medical history of dementia, history of lymphoma, not on treatment, but followed by oncology, history of spina bifida with chronic gait difficulty, chronic diastolic CHF, chronic venous stasis. Presented to the hospital with complaints of leg swelling, redness and rash in the groin area.  Pt dx with possible shingles and cellulitis.   Clinical Impression  Pt needed two person assist to safely mobilize OOB to [facility], and to recliner chair, only walking a very short distance to the [facility] and then being unable to walk further.  Per daughter she was mod I with RW for household distance gait.  Her current mobility is a significant difference from baseline.  She would be appropriate for SNF level rehab at discharge.  We did not get a chance to discuss this with her daughter as she left at the beginning of our session.   PT to follow acutely for deficits listed below.       Follow Up Recommendations SNF    Equipment Recommendations  None recommended by PT    Recommendations for Other Services   NA    Precautions / Restrictions Precautions Precautions: Fall Restrictions Weight Bearing Restrictions: No      Mobility  Bed Mobility Overal bed mobility: Needs Assistance Bed Mobility: Rolling;Sidelying to Sit Rolling: Min assist Sidelying to sit: Mod assist;+2 for physical assistance;HOB elevated       General bed mobility comments: difficulty with knee flexion impacting bed mobility and significant assistance needed to support trunk during transition to sit and weight shift hips using bed pad to get EOB.  Pt fearful of falling during this transition.   Transfers Overall transfer level: Needs assistance Equipment used: Rolling walker (2 wheeled) Transfers: Sit to/from [facility] Sit to  Stand: Mod assist;Max assist;+2 physical assistance Stand pivot transfers: Mod assist;Max assist;+2 physical assistance       General transfer comment: EOB, BSC, recliner. Mostly mod +2 with some moments of max +2 A. Limited knee flexion impacting transfer.  Verbal cues for safe hand placement.   Ambulation/Gait Ambulation/Gait assistance: +2 physical assistance;Mod assist Ambulation Distance (Feet): 8 Feet Assistive device: Rolling walker (2 wheeled) Gait Pattern/deviations: Step-to pattern;Shuffle;Trunk flexed Gait velocity: decreased   General Gait Details: Two person mod assist to support trunk while pt slowly and effortfully  moved both legs forward, shuffling forward with trunk flexed over RW.  She had an urgent need to urinate, so pt stabilized and BSC brought to her.  AFter toileting she was unable to walk any further and the recliner chair was brought behind her.         Balance Overall balance assessment: Needs assistance Sitting-balance support: Bilateral upper extremity supported;Feet supported Sitting balance-Leahy Scale: Fair Sitting balance - Comments: Initial difficulty achieving position of BLE support in seated position.    Standing balance support: Bilateral upper extremity supported Standing balance-Leahy Scale: Poor Standing balance comment: generally mod two person assist once standing.                              Pertinent Vitals/Pain Pain Assessment: Faces Faces Pain Scale: Hurts a little bit Pain Location: unspecified Pain Descriptors / Indicators: Sore;Guarding Pain Intervention(s): Limited activity within patient's tolerance;Monitored during session;Repositioned    Home Living Family/patient expects to be discharged to:: Private residence  Living Arrangements: Children Available Help at Discharge: Family;Other (Comment) (someone is with  her most of the time) Type of Home: House Home Access: Stairs to enter Entrance Stairs-Rails:  Psychiatric nurse of Steps: 3 Home Layout: One level Home Equipment: Wheelchair - [employer] - 4 wheels      Prior Function Level of Independence: Needs assistance   Gait / Transfers Assistance Needed: walker for transfers/mobility; min guard assist with stairs, sponge bathes  ADL's / Homemaking Assistance Needed: daughter reports pt  does not need physical assist with bathing/dressing. daughter reports pt sponge bathes at baseline and has someone with her during stairs.           Extremity/Trunk Assessment   Upper Extremity Assessment Upper Extremity Assessment: Defer to OT evaluation    Lower Extremity Assessment Lower Extremity Assessment: RLE deficits/detail;LLE deficits/detail RLE Deficits / Details: bil knee surgery with ROM and strength deficits.  Bil knees are hyper mobile in extension and hypmobine in flexion reaching just over 60 degrees of flex bil seated EOB.  ankles 3/5, knee extension 3/5, hip flexion 3-/5.   RLE Sensation:  (appeared grossly intact) LLE Deficits / Details: bil knee surgery with ROM and strength deficits.  Bil knees are hyper mobile in extension and hypmobine in flexion reaching just over 60 degrees of flex bil seated EOB.  ankles 3/5, knee extension 3/5, hip flexion 3-/5.   LLE Sensation:  (appeared grossly intact)    Cervical / Trunk Assessment Cervical / Trunk Assessment: Kyphotic  Communication   Communication: HOH  Cognition Arousal/Alertness: Awake/alert Behavior During Therapy: WFL for tasks assessed/performed Overall Cognitive Status: History of cognitive impairments - at baseline                                 General Comments: pleasantly confused      General Comments General comments (skin integrity, edema, etc.): Pt with red, irritated sores in the groin and genetalia area.  Incontinent of urine and had to be cleaned before mobilizing.  She reports she did not even know she was going.  She did  report the need to go once up and we successfully made it to [facility] without an accident.         Assessment/Plan    PT Assessment Patient needs continued PT services  PT Problem List Decreased strength;Decreased range of motion;Decreased activity tolerance;Decreased balance;Decreased mobility;Decreased cognition;Decreased knowledge of use of DME;Decreased safety awareness;Pain;Decreased skin integrity       PT Treatment Interventions DME instruction;Gait training;Stair training;Functional mobility training;Therapeutic activities;Balance training;Therapeutic exercise;Cognitive remediation;Patient/family education;Wheelchair mobility training    PT Goals (Current goals can be found in the Care Plan section)  Acute Rehab PT Goals Patient Stated Goal: unable to state PT Goal Formulation: Patient unable to participate in goal setting Time For Goal Achievement: 11/07/16 Potential to Achieve Goals: Fair    Frequency Min 3X/week        Co-evaluation PT/OT/SLP Co-Evaluation/Treatment: Yes Reason for Co-Treatment: Necessary to address cognition/behavior during functional activity;For patient/therapist safety;To address functional/ADL transfers PT goals addressed during session: Mobility/safety with mobility;Balance;Proper use of DME;Strengthening/ROM         End of Session Equipment Utilized During Treatment: Gait belt Activity Tolerance: Patient limited by fatigue Patient left: in chair;with call bell/phone within reach;with chair alarm set Nurse Communication: Mobility status PT Visit Diagnosis: Unsteadiness on feet (R26.81);Muscle weakness (generalized) (M62.81);Difficulty in walking, not elsewhere classified (R26.2)    Time: 8938-1017  PT Time Calculation (min) (ACUTE ONLY): 48 min   Charges:   PT Evaluation $PT Eval Moderate Complexity: 1 Procedure PT Treatments $Therapeutic Activity: 8-22 mins      Rayneisha Bouza B. [place], [place], DPT [phone]   10/24/2016, 11:01 PM

## 2016-10-24 NOTE — Progress Notes (Signed)
[facility] PROGRESS NOTE  Jill Flowers NWG:[id number] DOB: 11/07/1930 DOA: 10/22/2016  PCP: Vena Austria, MD  Brief History/Interval Summary: 83 year old Caucasian female with a past medical history of dementia, history of lymphoma, not on treatment, but followed by oncology, history of spina bifida with chronic gait difficulty, chronic diastolic CHF, chronic venous stasis. Presented to the hospital with complaints of leg swelling, redness and rash in the groin area.  Reason for Visit: Cellulitis/herpes zoster  Consultants: None  Procedures: None  Antibiotics: Ceftriaxone Valtrex  Subjective/Interval History: Patient remains confused and distracted. Unable to answer any questions today.   ROS: Denies any nausea or vomiting  Objective:  Vital Signs  Vitals:   10/23/16 0638 10/23/16 1349 10/23/16 2152 10/24/16 0429  BP: (!) 105/42 (!) 114/49 (!) 137/59 (!) 144/55  Pulse: 67 69 73 76  Resp: 19 18 18 16   Temp: 98.4 F (36.9 C) 97.7 F (36.5 C) 98 F (36.7 C) 97.4 F (36.3 C)  TempSrc: Oral Oral Oral Oral  SpO2: 99% 100% 98% 98%  Weight: 56.5 kg (124 lb 9 oz)   54.6 kg (120 lb 5.9 oz)    Intake/Output Summary (Last 24 hours) at 10/24/16 0830 Last data filed at 10/23/16 1700  Gross per 24 hour  Intake              719 ml  Output                0 ml  Net              719 ml   Filed Weights   10/23/16 0638 10/24/16 0429  Weight: 56.5 kg (124 lb 9 oz) 54.6 kg (120 lb 5.9 oz)   General appearance: Awake, alert and confused.  Resp: Clear to auscultation bilaterally. No wheezing, rales or rhonchi. Cardio: regular rate and rhythm, S1, S2 normal, no murmur, click, rub or gallop GI: soft, non-tender; bowel sounds normal; no masses,  no organomegaly Extremities: Continues to have open shallow wounds in the inner thigh area. These are small in size. No active drainage is noted. Large amounts and blisters posteriorly which I'm unable to see today. Slightly  erythematous. In the lower legs there is evidence for venous stasis dermatitis. Less erythematous in the lower legs today. Less warm to touch.  Neuro: She is awake and alert. Appears to be distracted this morning. No focal neurological deficits otherwise.    Lab Results:  Data Reviewed: I have personally reviewed following labs and imaging studies  CBC:  Recent Labs Lab 10/22/16 2045 10/23/16 0533 10/24/16 0538  WBC 2.0* 1.2* 1.6*  NEUTROABS 0.9* 0.2* 0.5*  HGB 12.5 10.5* 11.7*  HCT 38.6 32.2* 36.0  MCV 93.7 93.3 92.8  PLT 198 149* 578    Basic Metabolic Panel:  Recent Labs Lab 10/22/16 2045 10/23/16 0533 10/24/16 0538  NA 142 137 136  K 4.4 3.6 3.7  CL 111 108 104  CO2 24 22 25   GLUCOSE 93 76 91  BUN 26* 20 11  CREATININE 0.73 0.71 0.69  CALCIUM 8.1* 8.2* 8.6*    GFR: CrCl cannot be calculated (Unknown ideal weight.).  Liver Function Tests:  Recent Labs Lab 10/22/16 2045  AST 26  ALT 19  ALKPHOS 107  BILITOT 0.4  PROT 5.9*  ALBUMIN 3.0*     Medications:  Scheduled: . enoxaparin (LOVENOX) injection  40 mg Subcutaneous QHS  . sodium chloride flush  3 mL Intravenous Q12H  . valACYclovir  500  mg Oral TID  . zinc oxide   Topical Daily   Continuous: . sodium chloride    . cefTRIAXone (ROCEPHIN)  IV Stopped (10/23/16 2248)   KPQ:AESLPN chloride, acetaminophen **OR** acetaminophen, bisacodyl, ondansetron **OR** ondansetron (ZOFRAN) IV, polyethylene glycol, sodium chloride flush, Zinc Oxide  Assessment/Plan:  Principal Problem:   Cellulitis Active Problems:   Spina bifida ([place])   Lymphoma ([place])   COPD (chronic obstructive pulmonary disease) (HCC)   Chronic diastolic CHF (congestive heart failure) (HCC)   Skin maceration   Chronic venous stasis dermatitis of both lower extremities   Diaper dermatitis    Cellulitis versus herpes zoster in the inner thigh Patient presented with 3-4 weeks of progressive erythema, and now skin breakdown in  diaper region. Lesions were suspicious for herpes zoster with perhaps superimposed secondary bacterial infection. Daughter reports patient has had previous episodes of shingles. Valtrex was added. Lower legs were swollen and red, but appears more consistent with chronic venous stasis. They could have been some bacterial infection. Legs do look better today. Follow-up on blood cultures. Keep lower extremities elevated.   Venous stasis dermatitis with superimposed bacterial infection   Bilateral LE's with progressive swelling and redness; no vesicles or weeping. Appears more consistent with venous stasis dermatitis. Conservative management with elevation of feet, compression stockings, encourage ambulation. Less erythematous today. Continue ceftriaxone.  Chronic leukopenia  WBC noted to be low but stable. This has been seen previously as well. Continue to monitor for now. Currently afebrile.  Normocytic anemia Hemoglobin low but stable. No overt bleeding. Continue to monitor.  History of Lymphoma  Does not appear to an acute issue. She is not recieving any treatment currently, but is followed annually by oncology.  History of Spina bifida    Chronic and stable. Ambulates with walker. PT and OT.  Chronic diastolic CHF No significant edema noted currently. For the most part appears to be euvolemic. TTE (01/05/16) with EF 30-05%, grade 1 diastolic dysfunction, no WMA's, and no significant valvular disease. Continue to monitor.  DVT Prophylaxis: Lovenox    Code Status: DO NOT RESUSCITATE  Family Communication: Discussed with the patient and her daughter  Disposition Plan: Management as outlined above. Mobilize.    LOS: 0 days   [facility] Pager [phone] 10/24/2016, 8:30 AM  If 7PM-7AM, please contact night-coverage at [email or web address], password [facility]

## 2016-10-25 DIAGNOSIS — D72819 Decreased white blood cell count, unspecified: Secondary | ICD-10-CM

## 2016-10-25 LAB — BASIC METABOLIC PANEL
Anion gap: 7 (ref 5–15)
BUN: 11 mg/dL (ref 6–20)
CALCIUM: 8.4 mg/dL — AB (ref 8.9–10.3)
CO2: 24 mmol/L (ref 22–32)
CREATININE: 0.68 mg/dL (ref 0.44–1.00)
Chloride: 107 mmol/L (ref 101–111)
GFR calc Af Amer: >60 mL/min (ref >60)
GLUCOSE: 86 mg/dL (ref 65–99)
Potassium: 4.1 mmol/L (ref 3.5–5.1)
SODIUM: 138 mmol/L (ref 135–145)

## 2016-10-25 LAB — CBC
HCT: 35.2 % — ABNORMAL LOW (ref 36.0–46.0)
Hemoglobin: 11.3 g/dL — ABNORMAL LOW (ref 12.0–15.0)
MCH: 29.9 pg (ref 26.0–34.0)
MCHC: 32.1 g/dL (ref 30.0–36.0)
MCV: 93.1 fL (ref 78.0–100.0)
PLATELETS: 146 10*3/uL — AB (ref 150–400)
RBC: 3.78 MIL/uL — ABNORMAL LOW (ref 3.87–5.11)
RDW: 13.3 % (ref 11.5–15.5)
WBC: 1.1 10*3/uL — CL (ref 4.0–10.5)

## 2016-10-25 MED ORDER — CEFPODOXIME PROXETIL 200 MG PO TABS
200.0000 mg | ORAL_TABLET | Freq: Two times a day (BID) | ORAL | Status: DC
  Administered 2016-10-25 – 2016-10-26 (×3): 200 mg via ORAL
  Filled 2016-10-25 (×3): qty 1

## 2016-10-25 NOTE — Progress Notes (Signed)
[facility] PROGRESS NOTE  Jill Flowers:[id number] DOB: 07/28/1930 DOA: 10/22/2016  PCP: Vena Austria, MD  Brief History/Interval Summary: 83 year old Caucasian female with a past medical history of dementia, history of lymphoma, not on treatment, but followed by oncology, history of spina bifida with chronic gait difficulty, chronic diastolic CHF, chronic venous stasis. Presented to the hospital with complaints of leg swelling, redness and rash in the groin area.  Reason for Visit: Cellulitis/herpes zoster  Consultants: None  Procedures: None  Antibiotics: Ceftriaxone Valtrex  Subjective/Interval History: According to the nursing staff patient had a reasonably good night. She slept well. This morning she got confused and tried to get out of her bed. Did not have a fall but was lying face down on her bed. She seems to be upset. Does not remember what happened. She did calm down subsequently.  ROS: Denies any nausea or vomiting  Objective:  Vital Signs  Vitals:   10/24/16 0429 10/24/16 1500 10/24/16 2251 10/25/16 0622  BP: (!) 144/55  (!) 128/46 (!) 121/55  Pulse: 76  89 73  Resp: 16  18 16   Temp: 97.4 F (36.3 C)  99.2 F (37.3 C) 98.3 F (36.8 C)  TempSrc: Oral  Oral Oral  SpO2: 98%  97% 98%  Weight: 54.6 kg (120 lb 5.9 oz)     Height:  5' (1.524 m)      Intake/Output Summary (Last 24 hours) at 10/25/16 1229 Last data filed at 10/25/16 0945  Gross per 24 hour  Intake              890 ml  Output                0 ml  Net              890 ml   Filed Weights   10/23/16 0638 10/24/16 0429  Weight: 56.5 kg (124 lb 9 oz) 54.6 kg (120 lb 5.9 oz)   General appearance: Distracted this morning. No distress. Resp: Clear to auscultation bilaterally. No wheezing, rales or rhonchi. Cardio: regular rate and rhythm, S1, S2 normal, no murmur, click, rub or gallop GI: soft, non-tender; bowel sounds normal; no masses,  no organomegaly Extremities:  Improving erythema and wounds in the inner thigh areas bilaterally. No active drainage. In the lower legs there is evidence for venous stasis dermatitis. Erythema is much less compared to before. Less warm to touch.   Neuro: She is awake and alert. Appears to be distracted this morning. No focal neurological deficits otherwise.    Lab Results:  Data Reviewed: I have personally reviewed following labs and imaging studies  CBC:  Recent Labs Lab 10/22/16 2045 10/23/16 0533 10/24/16 0538 10/25/16 0608  WBC 2.0* 1.2* 1.6* 1.1*  NEUTROABS 0.9* 0.2* 0.5*  --   HGB 12.5 10.5* 11.7* 11.3*  HCT 38.6 32.2* 36.0 35.2*  MCV 93.7 93.3 92.8 93.1  PLT 198 149* 160 146*    Basic Metabolic Panel:  Recent Labs Lab 10/22/16 2045 10/23/16 0533 10/24/16 0538 10/25/16 0608  NA 142 137 136 138  K 4.4 3.6 3.7 4.1  CL 111 108 104 107  CO2 24 22 25 24   GLUCOSE 93 76 91 86  BUN 26* 20 11 11   CREATININE 0.73 0.71 0.69 0.68  CALCIUM 8.1* 8.2* 8.6* 8.4*    GFR: Estimated Creatinine Clearance: 40.6 mL/min (by C-G formula based on SCr of 0.68 mg/dL).  Liver Function Tests:  Recent Labs Lab 10/22/16 2045  AST 26  ALT 19  ALKPHOS 107  BILITOT 0.4  PROT 5.9*  ALBUMIN 3.0*     Medications:  Scheduled: . enoxaparin (LOVENOX) injection  40 mg Subcutaneous QHS  . sodium chloride flush  3 mL Intravenous Q12H  . valACYclovir  500 mg Oral TID  . zinc oxide   Topical Daily   Continuous: . sodium chloride    . cefTRIAXone (ROCEPHIN)  IV Stopped (10/25/16 0043)   OFV:WAQLRJ chloride, acetaminophen **OR** acetaminophen, bisacodyl, ondansetron **OR** ondansetron (ZOFRAN) IV, polyethylene glycol, sodium chloride flush, Zinc Oxide  Assessment/Plan:  Principal Problem:   Cellulitis Active Problems:   Spina bifida ([place])   Lymphoma ([place])   COPD (chronic obstructive pulmonary disease) (HCC)   Chronic diastolic CHF (congestive heart failure) (HCC)   Skin maceration   Chronic venous stasis  dermatitis of both lower extremities   Diaper dermatitis    Cellulitis versus herpes zoster in the inner thigh Patient presented with 3-4 weeks of progressive erythema, and now skin breakdown in groin region. Lesions were suspicious for herpes zoster with perhaps superimposed secondary bacterial infection. Daughter reports patient has had previous episodes of shingles. Lesions appear to be improving. Less erythema. Wounds have improved. Continue Valtrex. Change to oral antibiotics today. Lower legs were swollen and red, but appears more consistent with chronic venous stasis. They could have been some bacterial infection. Lower extremities also look improved. Blood cultures are negative so far. Keep lower extremities elevated.   Venous stasis dermatitis with superimposed bacterial infection   Bilateral LE's with progressive swelling and redness; no vesicles or weeping. Appears more consistent with venous stasis dermatitis. Conservative management with elevation of feet, compression stockings, encourage ambulation. Continue to improve. Change to oral antibiotics.   Chronic leukopenia  WBC noted to be low but stable. This has been seen previously as well. Continue to monitor for now. Currently afebrile.  Normocytic anemia Hemoglobin low but stable. No overt bleeding. Continue to monitor.  History of Lymphoma  Does not appear to an acute issue. She is not recieving any treatment currently, but is followed annually by oncology.  History of Spina bifida    Chronic and stable. Ambulates with walker. Seen by physical therapy. Skilled nursing facility placement is recommended. Consult Education officer, museum.  Chronic diastolic CHF No significant edema noted currently. For the most part appears to be euvolemic. TTE (01/05/16) with EF 73-66%, grade 1 diastolic dysfunction, no WMA's, and no significant valvular disease. Continue to monitor.  DVT Prophylaxis: Lovenox    Code Status: DO NOT RESUSCITATE    Family Communication: Discussed with the patient and her daughter  Disposition Plan: Management as outlined above. Mobilize. Change to oral antibiotics.    LOS: 1 day   [facility] Pager [phone] 10/25/2016, 12:29 PM  If 7PM-7AM, please contact night-coverage at [email or web address], password [facility]

## 2016-10-25 NOTE — Progress Notes (Signed)
CRITICAL VALUE ALERT  Critical value received:  WBC 1.1   Date of notification:  10/25/2016  Time of notification:  0815  Critical value read back:Yes.    Nurse who received alert:  Katelin  MD notified (1st page):  Curly Rim  Time of first page:  0820  MD notified (2nd page):  Time of second page:  Responding MD:  Curly Rim  Time MD responded:  [phone]

## 2016-10-26 MED ORDER — POLYETHYLENE GLYCOL 3350 17 G PO PACK: 17.0000 g | PACK | Freq: Every day | ORAL | 0 refills | Status: AC | PRN

## 2016-10-26 MED ORDER — VALACYCLOVIR HCL 500 MG PO TABS: 500.0000 mg | ORAL_TABLET | Freq: Three times a day (TID) | ORAL | 0 refills | Status: AC

## 2016-10-26 MED ORDER — CEFPODOXIME PROXETIL 200 MG PO TABS: 200.0000 mg | ORAL_TABLET | Freq: Two times a day (BID) | ORAL | 0 refills | Status: AC

## 2016-10-26 MED ORDER — ZINC OXIDE 12.8 % EX OINT: TOPICAL_OINTMENT | CUTANEOUS | 0 refills | Status: AC | PRN

## 2016-10-26 NOTE — Progress Notes (Signed)
Discharge instructions reviewed with patient and daughter. Daughter stated understanding. No IV access. Patient family at bedside for transportation

## 2016-10-26 NOTE — Discharge Summary (Signed)
[facility]  Physician Discharge Summary   Patient ID: Jill Flowers MRN: [id number] DOB/AGE: 1930-12-22 83 y.o.  Admit date: 10/22/2016 Discharge date: 10/26/2016  PCP: Vena Austria, MD  DISCHARGE DIAGNOSES:  Principal Problem:   Cellulitis Active Problems:   Spina bifida ([place])   Lymphoma ([place])   COPD (chronic obstructive pulmonary disease) (H[facility])   Chronic diastolic CHF (congestive heart failure) (H[facility])   Skin maceration   Chronic venous stasis dermatitis of both lower extremities   Diaper dermatitis   RECOMMENDATIONS FOR OUTPATIENT FOLLOW UP: 1. Home health has been ordered. 2. Patient's family has declined placement to skilled nursing facility   DISCHARGE CONDITION: fair  Diet recommendation: As before  [facility] Weights   10/23/16 0638 10/24/16 0429  Weight: 56.5 kg (124 lb 9 oz) 54.6 kg (120 lb 5.9 oz)    INITIAL HISTORY: 83 year old Caucasian female with a past medical history of dementia, history of lymphoma, not on treatment, but followed by oncology, history of spina bifida with chronic gait difficulty, chronic diastolic CHF, chronic venous stasis. Presented to the hospital with complaints of leg swelling, redness and rash in the groin area.   HOSPITAL COURSE:   Herpes zoster versus cellulitis, bilateral inner thigh Patient presented with 3-4 weeks of progressive erythema, and now skin breakdown in groin region. Lesions were suspicious for herpes zoster with perhaps superimposed secondary bacterial infection. Daughter reports patient has had previous episodes of shingles. Patient was initially started on ceftriaxone. She was then started also on Valtrex. Over the last 48 hours. Her lesions have significantly improved. Not as painful as before. She will complete a course of antibiotics as well as antiviral treatment.   Venous stasis dermatitis with superimposed bacterial infection  Bilateral LE's with progressive swelling and redness; no vesicles  or weeping. Appears more consistent with venous stasis dermatitis. She was also started on intravenous ceftriaxone. Change over to oral Vantin. Legs have significantly improved.  Chronic leukopenia  WBC noted to be low but stable. This has been seen previously as well. She remained afebrile.  Normocytic anemia Hemoglobin low but stable. No overt bleeding.   History of Lymphoma  Does not appear to an acute issue. She is not recieving any treatment currently, but is followed annually by oncology.  History of Spina bifida  Chronic and stable. Ambulates with walker. Seen by physical therapy. Skilled nursing facility placement is recommended. Social worker was consulted. Family, however, declines and wants to take the patient home. They have good support system. They have all equipment at home. In view of this, home health was ordered.  Chronic diastolic CHF Patient was euvolemic.   Cognitive impairment versus Dementia  Patient did appear to be distracted most of the times during this hospitalization. She did experience some sundowning during this hospitalization. Back to baseline over the last day and a half. Does not take any medications for same at home. Follow-up with PCP.  Patient has improved. Medically ready for discharge. Family declines skilled nursing facility placement. Home health has been ordered. Okay for discharge today.    PERTINENT LABS:  The results of significant diagnostics from this hospitalization (including imaging, microbiology, ancillary and laboratory) are listed below for reference.    Microbiology: Recent Results (from the past 240 hour(s))  Blood culture (routine x 2)     Status: None (Preliminary result)   Collection Time: 10/22/16  8:17 PM  Result Value Ref Range Status   Specimen Description BLOOD RIGHT FOREARM  Final   Special Requests  IN PEDIATRIC BOTTLE Blood Culture adequate volume  Final   Culture NO GROWTH 4 DAYS  Final   Report Status  PENDING  Incomplete  Blood culture (routine x 2)     Status: None (Preliminary result)   Collection Time: 10/22/16  8:44 PM  Result Value Ref Range Status   Specimen Description BLOOD RIGHT ANTECUBITAL  Final   Special Requests IN PEDIATRIC BOTTLE Blood Culture adequate volume  Final   Culture NO GROWTH 4 DAYS  Final   Report Status PENDING  Incomplete     Labs: Basic Metabolic Panel:  Recent Labs Lab 10/22/16 2045 10/23/16 0533 10/24/16 0538 10/25/16 0608  NA 142 137 136 138  K 4.4 3.6 3.7 4.1  CL 111 108 104 107  CO2 24 22 25 24   GLUCOSE 93 76 91 86  BUN 26* 20 11 11   CREATININE 0.73 0.71 0.69 0.68  CALCIUM 8.1* 8.2* 8.6* 8.4*   Liver Function Tests:  Recent Labs Lab 10/22/16 2045  AST 26  ALT 19  ALKPHOS 107  BILITOT 0.4  PROT 5.9*  ALBUMIN 3.0*   CBC:  Recent Labs Lab 10/22/16 2045 10/23/16 0533 10/24/16 0538 10/25/16 0608  WBC 2.0* 1.2* 1.6* 1.1*  NEUTROABS 0.9* 0.2* 0.5*  --   HGB 12.5 10.5* 11.7* 11.3*  HCT 38.6 32.2* 36.0 35.2*  MCV 93.7 93.3 92.8 93.1  PLT 198 149* 160 146*   BNP: BNP (last 3 results)  Recent Labs  01/04/16 2015  BNP 30.5     DISCHARGE EXAMINATION: Vitals:   10/25/16 1440 10/25/16 1709 10/25/16 2128 10/26/16 0700  BP: (!) 116/48 (!) 143/65 (!) 123/40 (!) 108/49  Pulse: 77 70 76 72  Resp: 16 16    Temp: 98.2 F (36.8 C) 98.7 F (37.1 C) 98.4 F (36.9 C) 98.4 F (36.9 C)  TempSrc: Oral Oral Oral Oral  SpO2: 98% 98% 96% 100%  Weight:      Height:       General appearance: alert, distracted and no distress Cardio: regular rate and rhythm, S1, S2 normal, no murmur, click, rub or gallop GI: soft, non-tender; bowel sounds normal; no masses,  no organomegaly Extremities: venous stasis dermatitis noted Skin: The lesions in the inner thigh have dried up. Erythema is almost resolved.  DISPOSITION: Home with home health  Discharge Instructions    Call MD for:  difficulty breathing, headache or visual disturbances     Complete by:  As directed    Call MD for:  extreme fatigue    Complete by:  As directed    Call MD for:  persistant dizziness or light-headedness    Complete by:  As directed    Call MD for:  persistant nausea and vomiting    Complete by:  As directed    Call MD for:  severe uncontrolled pain    Complete by:  As directed    Call MD for:  temperature >100.4    Complete by:  As directed    Discharge instructions    Complete by:  As directed    Home health has been ordered. If you don't hear anything in the next few days from the home health agency, please contact your primary care provider. Take your medications as prescribed. Diet as before.  You were cared for by a hospitalist during your hospital stay. If you have any questions about your discharge medications or the care you received while you were in the hospital after you are discharged, you  can call the unit and asked to speak with the hospitalist on call if the hospitalist that took care of you is not available. Once you are discharged, your primary care physician will handle any further medical issues. Please note that NO REFILLS for any discharge medications will be authorized once you are discharged, as it is imperative that you return to your primary care physician (or establish a relationship with a primary care physician if you do not have one) for your aftercare needs so that they can reassess your need for medications and monitor your lab values. If you do not have a primary care physician, you can call [phone] for a physician referral.   Increase activity slowly    Complete by:  As directed       ALLERGIES:  Allergies  Allergen Reactions  . Dilaudid [Hydromorphone Hcl]   . Hydrocodone-Acetaminophen     REACTION: N \\T \ V  . Penicillins     REACTION: swelling Has patient had a PCN reaction causing immediate rash, facial/tongue/throat swelling, SOB or lightheadedness with hypotension: Yes Has patient had a PCN reaction  causing severe rash involving mucus membranes or skin necrosis: No Has patient had a PCN reaction that required hospitalization No Has patient had a PCN reaction occurring within the last 10 years: No If all of the above answers are "NO", then may proceed with Cephalosporin use.   . Tramadol      Discharge Medication List as of 10/26/2016  2:09 PM    START taking these medications   Details  cefpodoxime (VANTIN) 200 MG tablet Take 1 tablet (200 mg total) by mouth every 12 (twelve) hours., Starting Sat 10/26/2016, Until Thu 10/31/2016, Print    polyethylene glycol (MIRALAX / GLYCOLAX) packet Take 17 g by mouth daily as needed for mild constipation., Starting Sat 10/26/2016, Print    valACYclovir (VALTREX) 500 MG tablet Take 1 tablet (500 mg total) by mouth 3 (three) times daily., Starting Sat 10/26/2016, Until Tue 11/05/2016, Print    Zinc Oxide (TRIPLE PASTE) 12.8 % ointment Apply topically as needed for irritation., Starting Sat 10/26/2016, Print         Follow-up Information    Vena Austria, MD. Schedule an appointment as soon as possible for a visit in 1 week(s).   Specialty:  Family Medicine Contact information: [place] [address] [address] Follow up.   Specialty:  Home Health Services Why:  your home health agency Contact information: [place] [address] [phone]           TOTAL DISCHARGE TIME: 66 mins  [facility] Pager [phone]  10/26/2016, 2:41 PM

## 2016-10-26 NOTE — Discharge Instructions (Signed)
Shingles Shingles, which is also known as herpes zoster, is an infection that causes a painful skin rash and fluid-filled blisters. Shingles is not related to genital herpes, which is a sexually transmitted infection. Shingles only develops in people who:  Have had chickenpox.  Have received the chickenpox vaccine. (This is rare.)  What are the causes? Shingles is caused by varicella-zoster virus (VZV). This is the same virus that causes chickenpox. After exposure to VZV, the virus stays in the body in an inactive (dormant) state. Shingles develops if the virus reactivates. This can happen many years after the initial exposure to VZV. It is not known what causes this virus to reactivate. What increases the risk? People who have had chickenpox or received the chickenpox vaccine are at risk for shingles. Infection is more common in people who:  Are older than age 50.  Have a weakened defense (immune) system, such as those with HIV, AIDS, or cancer.  Are taking medicines that weaken the immune system, such as transplant medicines.  Are under great stress.  What are the signs or symptoms? Early symptoms of this condition include itching, tingling, and pain in an area on your skin. Pain may be described as burning, stabbing, or throbbing. A few days or weeks after symptoms start, a painful red rash appears, usually on one side of the body in a bandlike or beltlike pattern. The rash eventually turns into fluid-filled blisters that break open, scab over, and dry up in about 2-3 weeks. At any time during the infection, you may also develop:  A fever.  Chills.  A headache.  An upset stomach.  How is this diagnosed? This condition is diagnosed with a skin exam. Sometimes, skin or fluid samples are taken from the blisters before a diagnosis is made. These samples are examined under a microscope or sent to a lab for testing. How is this treated? There is no specific cure for this condition.  Your health care provider will probably prescribe medicines to help you manage pain, recover more quickly, and avoid long-term problems. Medicines may include:  Antiviral drugs.  Anti-inflammatory drugs.  Pain medicines.  If the area involved is on your face, you may be referred to a specialist, such as an eye doctor (ophthalmologist) or an ear, nose, and throat (ENT) doctor to help you avoid eye problems, chronic pain, or disability. Follow these instructions at home: Medicines  Take medicines only as directed by your health care provider.  Apply an anti-itch or numbing cream to the affected area as directed by your health care provider. Blister and Rash Care  Take a cool bath or apply cool compresses to the area of the rash or blisters as directed by your health care provider. This may help with pain and itching.  Keep your rash covered with a loose bandage (dressing). Wear loose-fitting clothing to help ease the pain of material rubbing against the rash.  Keep your rash and blisters clean with mild soap and cool water or as directed by your health care provider.  Check your rash every day for signs of infection. These include redness, swelling, and pain that lasts or increases.  Do not pick your blisters.  Do not scratch your rash. General instructions  Rest as directed by your health care provider.  Keep all follow-up visits as directed by your health care provider. This is important.  Until your blisters scab over, your infection can cause chickenpox in people who have never had it or been vaccinated   against it. To prevent this from happening, avoid contact with other people, especially: ? Babies. ? Pregnant women. ? Children who have eczema. ? Elderly people who have transplants. ? People who have chronic illnesses, such as leukemia or AIDS. Contact a health care provider if:  Your pain is not relieved with prescribed medicines.  Your pain does not get better after  the rash heals.  Your rash looks infected. Signs of infection include redness, swelling, and pain that lasts or increases. Get help right away if:  The rash is on your face or nose.  You have facial pain, pain around your eye area, or loss of feeling on one side of your face.  You have ear pain or you have ringing in your ear.  You have loss of taste.  Your condition gets worse. This information is not intended to replace advice given to you by your health care provider. Make sure you discuss any questions you have with your health care provider. Document Released: 06/24/2005 Document Revised: 02/18/2016 Document Reviewed: 05/05/2014 Elsevier Interactive Patient Education  2017 [employer]  

## 2016-10-26 NOTE — Care Management Note (Signed)
Case Management Note  Patient Details  Name: Jill Flowers MRN: [id number] Date of Birth: [birth year]  Subjective/Objective:                  Cellulitis/herpes zoster Action/Plan: Discharge planning Expected Discharge Date:  10/26/16               Expected Discharge Plan:  [place]  In-House Referral:     Discharge planning Services  CM Consult  Post Acute Care Choice:  Home Health Choice offered to:  Adult Children  DME Arranged:  N/A DME Agency:  NA  HH Arranged:  RN, PT, OT, Nurse's Aide, Social Work [employer] Agency:  [facility] [employer]  Status of Service:  Completed, signed off  If discussed at [employer], dates discussed:    Additional Comments: CM spoke with daughter of pt ( as pt has dementia and is not able to articulate complexity of needs) Elsworth [place] [phone] for choice and family chooses [facility].  Referral called to [facility] rep, [place].  Pt has both a rolling walker and 3n1 at home.  No other CM needs were communicated. Dellie Catholic, RN 10/26/2016, 2:11 PM

## 2016-10-27 LAB — CULTURE, BLOOD (ROUTINE X 2)
CULTURE: NO GROWTH
Culture: NO GROWTH
SPECIAL REQUESTS: ADEQUATE
Special Requests: ADEQUATE

## 2017-09-01 ENCOUNTER — Emergency Department (HOSPITAL_COMMUNITY): Payer: Medicare Other

## 2017-09-01 ENCOUNTER — Inpatient Hospital Stay (HOSPITAL_COMMUNITY)
Admission: EM | Admit: 2017-09-01 | Discharge: 2017-09-07 | DRG: 603 | Disposition: A | Discharge status: Home Health | Payer: Medicare Other | Attending: Family Medicine | Admitting: Family Medicine

## 2017-09-01 ENCOUNTER — Encounter (HOSPITAL_COMMUNITY): Payer: Self-pay | Admitting: Emergency Medicine

## 2017-09-01 DIAGNOSIS — I872 Venous insufficiency (chronic) (peripheral): Secondary | ICD-10-CM | POA: Diagnosis present

## 2017-09-01 DIAGNOSIS — J449 Chronic obstructive pulmonary disease, unspecified: Secondary | ICD-10-CM | POA: Diagnosis present

## 2017-09-01 DIAGNOSIS — Z809 Family history of malignant neoplasm, unspecified: Secondary | ICD-10-CM | POA: Diagnosis not present

## 2017-09-01 DIAGNOSIS — L988 Other specified disorders of the skin and subcutaneous tissue: Secondary | ICD-10-CM

## 2017-09-01 DIAGNOSIS — J410 Simple chronic bronchitis: Secondary | ICD-10-CM | POA: Diagnosis not present

## 2017-09-01 DIAGNOSIS — I5032 Chronic diastolic (congestive) heart failure: Secondary | ICD-10-CM | POA: Diagnosis present

## 2017-09-01 DIAGNOSIS — Z823 Family history of stroke: Secondary | ICD-10-CM

## 2017-09-01 DIAGNOSIS — Z781 Physical restraint status: Secondary | ICD-10-CM

## 2017-09-01 DIAGNOSIS — Z90710 Acquired absence of both cervix and uterus: Secondary | ICD-10-CM

## 2017-09-01 DIAGNOSIS — E785 Hyperlipidemia, unspecified: Secondary | ICD-10-CM | POA: Diagnosis present

## 2017-09-01 DIAGNOSIS — Z7982 Long term (current) use of aspirin: Secondary | ICD-10-CM | POA: Diagnosis not present

## 2017-09-01 DIAGNOSIS — D72810 Lymphocytopenia: Secondary | ICD-10-CM

## 2017-09-01 DIAGNOSIS — Q059 Spina bifida, unspecified: Secondary | ICD-10-CM | POA: Diagnosis not present

## 2017-09-01 DIAGNOSIS — Z888 Allergy status to other drugs, medicaments and biological substances status: Secondary | ICD-10-CM

## 2017-09-01 DIAGNOSIS — L03818 Cellulitis of other sites: Secondary | ICD-10-CM | POA: Diagnosis not present

## 2017-09-01 DIAGNOSIS — L8990 Pressure ulcer of unspecified site, unspecified stage: Secondary | ICD-10-CM | POA: Diagnosis present

## 2017-09-01 DIAGNOSIS — Z885 Allergy status to narcotic agent status: Secondary | ICD-10-CM

## 2017-09-01 DIAGNOSIS — L89312 Pressure ulcer of right buttock, stage 2: Secondary | ICD-10-CM | POA: Diagnosis present

## 2017-09-01 DIAGNOSIS — D72819 Decreased white blood cell count, unspecified: Secondary | ICD-10-CM | POA: Diagnosis not present

## 2017-09-01 DIAGNOSIS — Z8572 Personal history of non-Hodgkin lymphomas: Secondary | ICD-10-CM | POA: Diagnosis not present

## 2017-09-01 DIAGNOSIS — L03116 Cellulitis of left lower limb: Secondary | ICD-10-CM | POA: Diagnosis present

## 2017-09-01 DIAGNOSIS — K219 Gastro-esophageal reflux disease without esophagitis: Secondary | ICD-10-CM

## 2017-09-01 DIAGNOSIS — D709 Neutropenia, unspecified: Secondary | ICD-10-CM | POA: Diagnosis present

## 2017-09-01 DIAGNOSIS — L89302 Pressure ulcer of unspecified buttock, stage 2: Secondary | ICD-10-CM

## 2017-09-01 DIAGNOSIS — Z880 Allergy status to penicillin: Secondary | ICD-10-CM | POA: Diagnosis not present

## 2017-09-01 DIAGNOSIS — F0391 Unspecified dementia with behavioral disturbance: Secondary | ICD-10-CM | POA: Diagnosis present

## 2017-09-01 DIAGNOSIS — L03115 Cellulitis of right lower limb: Secondary | ICD-10-CM | POA: Diagnosis not present

## 2017-09-01 DIAGNOSIS — B028 Zoster with other complications: Secondary | ICD-10-CM | POA: Diagnosis present

## 2017-09-01 DIAGNOSIS — L0390 Cellulitis, unspecified: Secondary | ICD-10-CM | POA: Diagnosis present

## 2017-09-01 DIAGNOSIS — B029 Zoster without complications: Secondary | ICD-10-CM | POA: Diagnosis not present

## 2017-09-01 LAB — BASIC METABOLIC PANEL
Anion gap: 9 (ref 5–15)
BUN: 20 mg/dL (ref 6–20)
CALCIUM: 8.6 mg/dL — AB (ref 8.9–10.3)
CO2: 25 mmol/L (ref 22–32)
CREATININE: 0.69 mg/dL (ref 0.44–1.00)
Chloride: 104 mmol/L (ref 101–111)
GFR calc non Af Amer: >60 mL/min (ref >60)
Glucose, Bld: 102 mg/dL — ABNORMAL HIGH (ref 65–99)
Potassium: 4.9 mmol/L (ref 3.5–5.1)
SODIUM: 138 mmol/L (ref 135–145)

## 2017-09-01 LAB — CBC WITH DIFFERENTIAL/PLATELET
BASOS PCT: 0 %
Basophils Absolute: 0 10*3/uL (ref 0.0–0.1)
EOS ABS: 0.1 10*3/uL (ref 0.0–0.7)
Eosinophils Relative: 3 %
HCT: 40.7 % (ref 36.0–46.0)
Hemoglobin: 13.3 g/dL (ref 12.0–15.0)
Lymphocytes Relative: 19 %
Lymphs Abs: 0.3 10*3/uL — ABNORMAL LOW (ref 0.7–4.0)
MCH: 30.4 pg (ref 26.0–34.0)
MCHC: 32.7 g/dL (ref 30.0–36.0)
MCV: 92.9 fL (ref 78.0–100.0)
MONO ABS: 0.2 10*3/uL (ref 0.1–1.0)
Monocytes Relative: 14 %
Neutro Abs: 1 10*3/uL — ABNORMAL LOW (ref 1.7–7.7)
Neutrophils Relative %: 64 %
Platelets: 186 10*3/uL (ref 150–400)
RBC: 4.38 MIL/uL (ref 3.87–5.11)
RDW: 13.5 % (ref 11.5–15.5)
WBC: 1.6 10*3/uL — ABNORMAL LOW (ref 4.0–10.5)

## 2017-09-01 LAB — URINALYSIS, ROUTINE W REFLEX MICROSCOPIC
BACTERIA UA: NONE SEEN
Bilirubin Urine: NEGATIVE
Glucose, UA: NEGATIVE mg/dL
Hgb urine dipstick: NEGATIVE
Ketones, ur: NEGATIVE mg/dL
NITRITE: NEGATIVE
Protein, ur: NEGATIVE mg/dL
SPECIFIC GRAVITY, URINE: 1.025 (ref 1.005–1.030)
pH: 5 (ref 5.0–8.0)

## 2017-09-01 LAB — SEDIMENTATION RATE: SED RATE: 43 mm/h — AB (ref 0–22)

## 2017-09-01 LAB — C-REACTIVE PROTEIN: CRP: 5.1 mg/dL — AB (ref <1.0)

## 2017-09-01 LAB — I-STAT CG4 LACTIC ACID, ED: Lactic Acid, Venous: 1.25 mmol/L (ref 0.5–1.9)

## 2017-09-01 MED ORDER — VALACYCLOVIR HCL 500 MG PO TABS
1000.0000 mg | ORAL_TABLET | Freq: Three times a day (TID) | ORAL | Status: DC
  Administered 2017-09-01 – 2017-09-02 (×4): 1000 mg via ORAL
  Filled 2017-09-01 (×6): qty 2

## 2017-09-01 MED ORDER — VALACYCLOVIR HCL 500 MG PO TABS: 1000.0000 mg | ORAL_TABLET | Freq: Three times a day (TID) | ORAL | Status: DC

## 2017-09-01 MED ORDER — VALACYCLOVIR HCL 500 MG PO TABS: 500.0000 mg | ORAL_TABLET | Freq: Once | ORAL | Status: DC

## 2017-09-01 MED ORDER — VANCOMYCIN HCL IN DEXTROSE 1-5 GM/200ML-% IV SOLN
1000.0000 mg | Freq: Once | INTRAVENOUS | Status: AC
  Administered 2017-09-01: 1000 mg via INTRAVENOUS
  Filled 2017-09-01: qty 200

## 2017-09-01 MED ORDER — SODIUM CHLORIDE 0.9 % IV BOLUS (SEPSIS)
1000.0000 mL | Freq: Once | INTRAVENOUS | Status: AC
  Administered 2017-09-01: 1000 mL via INTRAVENOUS

## 2017-09-01 MED ORDER — FENTANYL CITRATE (PF) 100 MCG/2ML IJ SOLN
50.0000 ug | Freq: Once | INTRAMUSCULAR | Status: AC
  Administered 2017-09-01: 50 ug via INTRAVENOUS
  Filled 2017-09-01: qty 2

## 2017-09-01 NOTE — ED Notes (Addendum)
Rectal temp. Attempted but unsuccessful, pt unable to be placed on side due to sores on lower portion of body.

## 2017-09-01 NOTE — ED Provider Notes (Signed)
[name] DEPT Provider Note   CSN: [id number] Arrival date & time: 09/01/17  1415     History   Chief Complaint Chief Complaint  Patient presents with  . Wound Dehiscence    HPI Jill Flowers is a 84 y.o. female with PMH/o Cellulitis, CHF, lymphoma, Spina bifida who presents for evaluation of wounds to BLE.  Daughter reports that patient has chronic wounds to the lower extremities to the perineum region..  Daughter reports over the last few days, the wounds have become worsened.  Additionally, her noted that today, the wound started bleeding and weeping.  Patient reports diffuse pain to the area that is worsened in the last few days.  Daughter says she cannot ambulate secondary to her spina bifida.  Daughter states that patient is mostly sedentary and will sit in the chair without moving.  She has had these wounds for several years but does not have any wound care.  She does not take any medication or apply any cream.  Daughter denies any fever, chest pain, difficulty breathing, difficulty tolerating p.o., nausea/vomiting.  The history is provided by the patient.    Past Medical History:  Diagnosis Date  . Cellulitis and abscess of leg 10/2016  . CHF (congestive heart failure) ([place])   . Lymphoma ([place])    "mild" per daughter  . Lymphoma ([place])    "mild" per daughter  . Spina bifida [facility])     Patient Active Problem List   Diagnosis Date Noted  . Shingles 09/01/2017  . Leukopenia 09/01/2017  . Skin maceration 10/22/2016  . Chronic venous stasis dermatitis of both lower extremities 10/22/2016  . Diaper dermatitis 10/22/2016  . Pressure ulcer 01/05/2016  . COPD (chronic obstructive pulmonary disease) ([place]) 01/04/2016  . Cellulitis 01/04/2016  . Leg swelling 01/04/2016  . Chronic diastolic CHF (congestive heart failure) ([place]) 01/04/2016  . Lymphoma ([place])   . History of oral candidiasis 09/28/2010  . PNEUMONIA, COMMUNITY ACQUIRED, PNEUMOCOCCAL  07/26/2009  . PULMONARY NODULE, LEFT UPPER LOBE 07/26/2009  . MALNUTRITION, PROTEIN-CALORIE 07/21/2008  . ACUTE BRONCHITIS 07/21/2008  . COPD 05/06/2008  . LOSS OF WEIGHT 04/12/2008  . URI 03/07/2008  . HERPES SIMPLEX, UNCOMPLICATED 25/95/6387  . LYMPHOMA 01/21/2007  . FIBROIDS, UTERUS 01/21/2007  . HYPERLIPIDEMIA 01/21/2007  . GERD 01/21/2007  . OVERACTIVE BLADDER 01/21/2007  . DEGENERATIVE JOINT DISEASE 01/21/2007  . Spina bifida ([place]) 01/21/2007  . LEG EDEMA, BILATERAL 01/21/2007  . PELVIC MASS 01/21/2007  . HYSTERECTOMY, HX OF 01/21/2007    Past Surgical History:  Procedure Laterality Date  . ABDOMINAL HYSTERECTOMY    . KNEE SURGERY    . spinda bifida     when she was 84 yo    OB History    No data available       Home Medications    Prior to Admission medications   Medication Sig Start Date End Date Taking? Authorizing Provider  aspirin EC 81 MG tablet Take 81 mg by mouth daily.   Yes [provider]  ibuprofen (ADVIL,MOTRIN) 200 MG tablet Take 400 mg by mouth at bedtime as needed for moderate pain.   Yes [provider]  polyethylene glycol (MIRALAX / GLYCOLAX) packet Take 17 g by mouth daily as needed for mild constipation. Patient not taking: Reported on 09/01/2017 10/26/16   Bonnielee Haff, MD  Zinc Oxide (TRIPLE PASTE) 12.8 % ointment Apply topically as needed for irritation. Patient not taking: Reported on 09/01/2017 10/26/16   Bonnielee Haff, MD  Family History Family History  Problem Relation Age of Onset  . Cancer Father   . Cancer Sister   . Stroke Brother     Social History Social History   Tobacco Use  . Smoking status: Never Smoker  . Smokeless tobacco: Never Used  Substance Use Topics  . Alcohol use: No  . Drug use: No     Allergies   Dilaudid [hydromorphone hcl]; Hydrocodone-acetaminophen; Penicillins; and Tramadol   Review of Systems Review of Systems  Constitutional: Negative for fever.  Respiratory:  Negative for cough and shortness of breath.   Cardiovascular: Negative for chest pain.  Gastrointestinal: Negative for abdominal pain, nausea and vomiting.  Genitourinary: Negative for dysuria and hematuria.  Skin: Positive for color change and wound.  Neurological: Negative for headaches.     Physical Exam Updated Vital Signs BP (!) 116/57 (BP Location: Left Arm)   Pulse 87   Temp (!) 97.5 F (36.4 C) (Oral)   Resp 17   SpO2 100%   Physical Exam  Constitutional: She appears well-developed and well-nourished.  Elderly and frail appearing  HENT:  Head: Normocephalic and atraumatic.  Mouth/Throat: Oropharynx is clear and moist and mucous membranes are normal.  Eyes: Conjunctivae, EOM and lids are normal. Pupils are equal, round, and reactive to light.  Neck: Full passive range of motion without pain.  Cardiovascular: Normal rate, regular rhythm, normal heart sounds and normal pulses. Exam reveals no gallop and no friction rub.  No murmur heard. Pulses:      Dorsalis pedis pulses are 2+ on the right side, and 2+ on the left side.  Pulmonary/Chest: Effort normal and breath sounds normal.  Abdominal: Soft. Normal appearance. There is no tenderness. There is no rigidity and no guarding.  Musculoskeletal: Normal range of motion.  Neurological: She is alert.  Intermittently demented Follows commands Moves all extremities spontaneously  Skin: Skin is warm and dry. Capillary refill takes less than 2 seconds.  Small area of skin breakdown noted to the posterior aspect of the left lower calf.  Minimal surrounding warmth and erythema.  There is a small skin breakdown noted to the posterior aspect of the right upper leg.  Minimal surrounding warmth and erythema.  The sacral area is diffusely erythematous and indurated.  There are diffuse excoriations and grouped vesicles noted.  The vesicles extend from the sacral region down the posterior aspect bilateral lower extremities.  There also  appears to be stage II ulcer noted to the right gluteus that measures approximately 4 cm active drainage.  Small stage II ulcer noted to the left gluteus that measures approximately 2 cm in diameter.  There is some surrounding warmth, erythema.  It is actively draining.  Does not appear to track.  No crepitus noted.  Psychiatric: She has a normal mood and affect. Her speech is normal.  Nursing note and vitals reviewed.      ED Treatments / Results  Labs (all labs ordered are listed, but only abnormal results are displayed) Labs Reviewed  CBC WITH DIFFERENTIAL/PLATELET - Abnormal; Notable for the following components:      Result Value   WBC 1.6 (*)    Neutro Abs 1.0 (*)    Lymphs Abs 0.3 (*)    All other components within normal limits  BASIC METABOLIC PANEL - Abnormal; Notable for the following components:   Glucose, Bld 102 (*)    Calcium 8.6 (*)    All other components within normal limits  URINALYSIS, ROUTINE W REFLEX  MICROSCOPIC - Abnormal; Notable for the following components:   Color, Urine AMBER (*)    APPearance TURBID (*)    Leukocytes, UA TRACE (*)    Squamous Epithelial / LPF 0-5 (*)    All other components within normal limits  SEDIMENTATION RATE - Abnormal; Notable for the following components:   Sed Rate 43 (*)    All other components within normal limits  C-REACTIVE PROTEIN - Abnormal; Notable for the following components:   CRP 5.1 (*)    All other components within normal limits  CULTURE, BLOOD (ROUTINE X 2)  CULTURE, BLOOD (ROUTINE X 2)  I-STAT CG4 LACTIC ACID, ED    EKG  EKG Interpretation None       Radiology Dg Pelvis 1-2 Views  Result Date: 09/01/2017 CLINICAL DATA:  Multiple superficial wounds to the lower extremities EXAM: PELVIS - 1-2 VIEW COMPARISON:  Radiograph 07/05/2004 FINDINGS: SI joint degenerative changes. Pubic symphysis and rami are intact. Both femoral necks are obscured by the trochanters. No gross fracture or malalignment. No  obvious bony erosive changes. IMPRESSION: No acute osseous abnormality allowing for suboptimal positioning. Electronically Signed   By: Donavan Foil M.D.   On: 09/01/2017 16:33    Procedures Procedures (including critical care time)  Medications Ordered in ED Medications  valACYclovir (VALTREX) tablet 1,000 mg (not administered)  sodium chloride 0.9 % bolus 1,000 mL (0 mLs Intravenous Stopped 09/01/17 1930)  vancomycin (VANCOCIN) IVPB 1000 mg/200 mL premix (0 mg Intravenous Stopped 09/01/17 2218)  fentaNYL (SUBLIMAZE) injection 50 mcg (50 mcg Intravenous Given 09/01/17 2218)     Initial Impression / Assessment and Plan / ED Course  I have reviewed the triage vital signs and the nursing notes.  Pertinent labs & imaging results that were available during my care of the patient were reviewed by me and considered in my medical decision making (see chart for details).     84 year old female who presents for evaluation of worsening wounds to bilateral lower extremities.  Daughter reports patient has chronic wounds of bilateral lower extremities but over the last 2 days, they have worsened.  Daughter notes bleeding and drainage from the wounds.  Patient reports worsening pain diffusely to the area.  No fevers.  Patient does have a history of shingles.  Daughter also reports that patient's wounds have gotten infected and required hospital admission. Patient is afebrile, non-toxic appearing, sitting comfortably on examination table.  Vital signs stable on exam, patient has small skin breakdowns on the posterior aspects of bilateral lower extremities.  On the sacral area, patient has diffuse erythema with overlying excoriation and grouped vesicles that extend down to the posterior aspect of the bilateral lower extremity.  Concern for shingles with overlying superimposed bacterial infection.  No evidence of tracking from the wounds but will get x-ray for evaluation of subcutaneous emphysema.  Plan for basic  labs.  I-stat lactic is 1.25.  BMP is unremarkable.  CBC shows white blood cell count of 1.6.  Records reviewed to this consistent with previous.  Patient has a history of chronic leukopenia.  UA with trace leukocytes.  X-ray of pelvis shows no evidence of subcutaneous emphysema.  Discussed patient with Dr. Ellender Hose after the pending evaluation.  Concern for shingles with superimposed bacterial infection.  We will plan to start patient on robotics and will give a dose of Valtrex here in the ED.  Will likely require admission for IV antibiotics.  Discussed patient with Dr. Roel Cluck (hospitalist). Will admit.   Final  Clinical Impressions(s) / ED Diagnoses   Final diagnoses:  Cellulitis of other specified site  Herpes zoster without complication    ED Discharge Orders    None       Jill Flowers 09/01/17 2327    Duffy Bruce, MD 09/02/17 1130

## 2017-09-01 NOTE — H&P (Signed)
Jill Flowers:[id number] DOB: 09-25-1930 DOA: 09/01/2017     PCP: Maury Dus, MD   Outpatient Specialists: [facility] Patient coming from:  home Lives  With family    Chief Complaint: Leg swelling redness and rash  HPI: Jill Flowers is a 84 y.o. female with medical history significant of dementia, history of lymphoma, not on treatment, but followed by oncology, history of spina bifida with chronic gait difficulty, chronic diastolic CHF, chronic venous stasis       Presented with redness and ulceration in the back of her leg  Similar rash to the inner thighs bilaterally which felt to be possible herpes zoster with superimposed cellulitis patient has had recurrent shingles in the past.  In April she was treated with Valtrex with good improvement.  Patient has also chronic venous stasis dermatitis with recurrent cellulitis. While in ER: Patient is combative pulling out IVs currently in soft restraints was diagnosed with zoster and started on antivirals as well as vancomycin Significant initial  Findings: Sed rate 43 lactic acid 1.25 WBC 1.6 which is around baseline hemoglobin 13.3 Sodium 138  K4.9 creatinine 0.69 Plain imaging of the pelvis nonacute osseous abnormality IN ER:  Temp (24hrs), Avg:97.5 F (36.4 C), Min:97.5 F (36.4 C), Max:97.5 F (36.4 C)      on arrival  ED Triage Vitals [09/01/17 1422]  Enc Vitals Group     BP (!) 109/44     Pulse Rate 80     Resp 16     Temp (!) 97.5 F (36.4 C)     Temp Source Oral     SpO2 99 %     Weight      Height      Head Circumference      Peak Flow      Pain Score 7     Pain Loc      Pain Edu?      Excl. in [place]?     Latest RR 16 1300% HR 74 BP 122/45  Following Medications were ordered in ER: Medications  vancomycin (VANCOCIN) IVPB 1000 mg/200 mL premix (1,000 mg Intravenous New Bag/Given 09/01/17 2037)  valACYclovir (VALTREX) tablet 500 mg (not administered)  fentaNYL (SUBLIMAZE) injection 50 mcg (not  administered)  sodium chloride 0.9 % bolus 1,000 mL (0 mLs Intravenous Stopped 09/01/17 1930)      Hospitalist was called for admission for    recurrent zoster infection with superficial cellulitis  Regarding pertinent Chronic problems: History of lymphoma currently not on any chemotherapy followed  by oncology only on yearly basis  Review of Systems:    Pertinent positives include: Leg pain and rash  Constitutional:  No weight loss, night sweats, Fevers, chills, fatigue, weight loss  HEENT:  No headaches, Difficulty swallowing,Tooth/dental problems,Sore throat,  No sneezing, itching, ear ache, nasal congestion, post nasal drip,  Cardio-vascular:  No chest pain, Orthopnea, PND, anasarca, dizziness, palpitations.no Bilateral lower extremity swelling  GI:  No heartburn, indigestion, abdominal pain, nausea, vomiting, diarrhea, change in bowel habits, loss of appetite, melena, blood in stool, hematemesis Resp:  no shortness of breath at rest. No dyspnea on exertion, No excess mucus, no productive cough, No non-productive cough, No coughing up of blood.No change in color of mucus.No wheezing. Skin:  no rash or lesions. No jaundice GU:  no dysuria, change in color of urine, no urgency or frequency. No straining to urinate.  No flank pain.  Musculoskeletal:  No joint pain or no joint swelling.  No decreased range of motion. No back pain.  Psych:  No change in mood or affect. No depression or anxiety. No memory loss.  Neuro: no localizing neurological complaints, no tingling, no weakness, no double vision, no gait abnormality, no slurred speech, no confusion  As per HPI otherwise 10 point review of systems negative.   Past Medical History: Past Medical History:  Diagnosis Date  . Cellulitis and abscess of leg 10/2016  . CHF (congestive heart failure) ([place])   . Lymphoma ([place])    "mild" per daughter  . Lymphoma ([place])    "mild" per daughter  . Spina bifida [facility])    Past Surgical  History:  Procedure Laterality Date  . ABDOMINAL HYSTERECTOMY    . KNEE SURGERY    . spinda bifida     when she was 84 yo     Social History:  Ambulatory walker  bound    reports that  has never smoked. she has never used smokeless tobacco. She reports that she does not drink alcohol or use drugs.  Allergies:   Allergies  Allergen Reactions  . Dilaudid [Hydromorphone Hcl]   . Hydrocodone-Acetaminophen     REACTION: N \\T \ V  . Penicillins     REACTION: swelling Has patient had a PCN reaction causing immediate rash, facial/tongue/throat swelling, SOB or lightheadedness with hypotension: Yes Has patient had a PCN reaction causing severe rash involving mucus membranes or skin necrosis: YES Has patient had a PCN reaction that required hospitalization No Has patient had a PCN reaction occurring within the last 10 years: No If all of the above answers are "NO", then may proceed with Cephalosporin use.   . Tramadol        Family History:   Family History  Problem Relation Age of Onset  . Cancer Father   . Cancer Sister   . Stroke Brother     Medications: Prior to Admission medications   Medication Sig Start Date End Date Taking? Authorizing Provider  aspirin EC 81 MG tablet Take 81 mg by mouth daily.   Yes [provider]  ibuprofen (ADVIL,MOTRIN) 200 MG tablet Take 400 mg by mouth at bedtime as needed for moderate pain.   Yes [provider]  polyethylene glycol (MIRALAX / GLYCOLAX) packet Take 17 g by mouth daily as needed for mild constipation. Patient not taking: Reported on 09/01/2017 10/26/16   Bonnielee Haff, MD  Zinc Oxide (TRIPLE PASTE) 12.8 % ointment Apply topically as needed for irritation. Patient not taking: Reported on 09/01/2017 10/26/16   Bonnielee Haff, MD    Physical Exam: Patient Vitals for the past 24 hrs:  BP Temp Temp src Pulse Resp SpO2  09/01/17 1900 (!) 122/45 - - 74 16 100 %  09/01/17 1834 (!) 121/49 - - 75 16 100 %    09/01/17 1629 (!) 110/41 - - 73 16 99 %  09/01/17 1422 (!) 109/44 (!) 97.5 F (36.4 C) Oral 80 16 99 %    1. General:  in No Acute distress   Chronically ill  -appearing 2. Psychological: Alert but not  Oriented combative 3. Head/ENT:    Dry Mucous Membranes                          Head Non traumatic, neck supple                           Poor Dentition  4. SKIN:   decreased Skin turgor,  Skin clean Dry multiple areas of breakdown and ulceration noted on buttocks some vesicular lesions present on in the findings similar to prior description     5. Heart: Regular rate and rhythm no  Murmur, no Rub or gallop 6. Lungs:  no wheezes or crackles   7. Abdomen: Soft, non-tender, Non distended   8. Lower extremities: no clubbing, cyanosis, or edema 9. Neurologically Grossly intact, moving all 4 extremities equally  10. MSK: Normal range of motion   body mass index is unknown because there is no height or weight on file.  Labs on Admission:   Labs on Admission: I have personally reviewed following labs and imaging studies  CBC: Recent Labs  Lab 09/01/17 1543  WBC 1.6*  NEUTROABS 1.0*  HGB 13.3  HCT 40.7  MCV 92.9  PLT 161   Basic Metabolic Panel: Recent Labs  Lab 09/01/17 1543  NA 138  K 4.9  CL 104  CO2 25  GLUCOSE 102*  BUN 20  CREATININE 0.69  CALCIUM 8.6*   GFR: CrCl cannot be calculated (Unknown ideal weight.). Liver Function Tests: No results for input(s): AST, ALT, ALKPHOS, BILITOT, PROT, ALBUMIN in the last 168 hours. No results for input(s): LIPASE, AMYLASE in the last 168 hours. No results for input(s): AMMONIA in the last 168 hours. Coagulation Profile: No results for input(s): INR, PROTIME in the last 168 hours. Cardiac Enzymes: No results for input(s): CKTOTAL, CKMB, CKMBINDEX, TROPONINI in the last 168 hours. BNP (last 3 results) No results for input(s): PROBNP in the last 8760 hours. HbA1C: No results for input(s): HGBA1C in the last 72  hours. CBG: No results for input(s): GLUCAP in the last 168 hours. Lipid Profile: No results for input(s): CHOL, HDL, LDLCALC, TRIG, CHOLHDL, LDLDIRECT in the last 72 hours. Thyroid Function Tests: No results for input(s): TSH, T4TOTAL, FREET4, T3FREE, THYROIDAB in the last 72 hours. Anemia Panel: No results for input(s): VITAMINB12, FOLATE, FERRITIN, TIBC, IRON, RETICCTPCT in the last 72 hours. Urine analysis:    Component Value Date/Time   COLORURINE AMBER (A) 09/01/2017 1545   APPEARANCEUR TURBID (A) 09/01/2017 1545   LABSPEC 1.025 09/01/2017 1545   LABSPEC 1.015 09/15/2007 1001   PHURINE 5.0 09/01/2017 1545   GLUCOSEU NEGATIVE 09/01/2017 1545   HGBUR NEGATIVE 09/01/2017 1545   BILIRUBINUR NEGATIVE 09/01/2017 1545   BILIRUBINUR Negative 09/15/2007 1001   KETONESUR NEGATIVE 09/01/2017 1545   PROTEINUR NEGATIVE 09/01/2017 1545   UROBILINOGEN 1.0 07/10/2009 0832   NITRITE NEGATIVE 09/01/2017 1545   LEUKOCYTESUR TRACE (A) 09/01/2017 1545   LEUKOCYTESUR Negative 09/15/2007 1001   Sepsis Labs: @LABRCNTIP (procalcitonin:4,lacticidven:4) )No results found for this or any previous visit (from the past 240 hour(s)).     UA  no evidence of UTI     Lab Results  Component Value Date   HGBA1C 4.9 01/05/2016    CrCl cannot be calculated (Unknown ideal weight.).  BNP (last 3 results) No results for input(s): PROBNP in the last 8760 hours.   ECG REPORT Not obtained  There were no vitals filed for this visit.   Cultures:    Component Value Date/Time   SDES BLOOD RIGHT ANTECUBITAL 10/22/2016 2044   SPECREQUEST IN PEDIATRIC BOTTLE Blood Culture adequate volume 10/22/2016 2044   CULT NO GROWTH 5 DAYS 10/22/2016 2044   REPTSTATUS 10/27/2016 FINAL 10/22/2016 2044     Radiological Exams on Admission: Dg Pelvis 1-2 Views  Result Date: 09/01/2017 CLINICAL DATA:  Multiple  superficial wounds to the lower extremities EXAM: PELVIS - 1-2 VIEW COMPARISON:  Radiograph 07/05/2004  FINDINGS: SI joint degenerative changes. Pubic symphysis and rami are intact. Both femoral necks are obscured by the trochanters. No gross fracture or malalignment. No obvious bony erosive changes. IMPRESSION: No acute osseous abnormality allowing for suboptimal positioning. Electronically Signed   By: Donavan Foil M.D.   On: 09/01/2017 16:33    Chart has been reviewed    Assessment/Plan  84 y.o. female with medical history significant of dementia, history of lymphoma, not on treatment, but followed by oncology, history of spina bifida with chronic gait difficulty, chronic diastolic CHF, chronic venous stasis  Admitted for possible recurrent shingles versus skin breakdown from chronic incontinence with superimposed cellulitis  Present on Admission: . Skin maceration wound care consult . Pressure ulcer areas of skin induration and inflammation suggest infectious process continue vancomycin . Shingles vesicular lesions unclear etiology but in the past have improved with valacyclovir will restart thousand milligrams 3 times daily)  given open lesions and frail patient will order airborn precautions . COPD (chronic obstructive pulmonary disease) ([place]) stable continue home medications . Chronic diastolic CHF (congestive heart failure) ([facility]) currently appears to be stable if anything somewhat on the dry side . GERD stable continue home medications . Cellulitis in the setting of skin maceration from chronic incontinence from now treat broadly with vancomycin obtain MRSA screening patient has penicillin allergies . Leukopenia stable and unchanged chronic Dementia patient showing signs of combative behavior and sundowning control pain and agitation as needed  Other plan as per orders.  DVT prophylaxis:    Lovenox     Code Status:  FULL CODE for now will need to discuss with family in AM Family Communication:   Family not at  Bedside   Disposition Plan:   likely will need placement for  rehabilitation                                              Would benefit from PT/OT eval prior to DC   ordered                                               Consults called: none  Admission status:    Inpatient    Level of care      medical floor        I have spent a total of 56 min on this admission   Emon Miggins 09/02/2017, 2:10 AM   [facility]  Pager [phone]   after 2 AM please page floor coverage PA If 7AM-7PM, please contact the day team taking care of the patient  [email or web address]  Password [email or web address]

## 2017-09-01 NOTE — ED Triage Notes (Signed)
Per EMS-states multiple superficial wounds of both lower extremities-states going on since November-states they started bleeding today-patient is not being treated for the wounds-sits in a seated position for most of the day

## 2017-09-01 NOTE — ED Notes (Signed)
Purewick being placed on pt.

## 2017-09-01 NOTE — ED Notes (Signed)
Bed: [facility] Expected date:  Expected time:  Means of arrival:  Comments: 

## 2017-09-01 NOTE — Progress Notes (Signed)
A consult was received from an ED physician for Vancomycin per pharmacy dosing.  The patient's profile has been reviewed for ht/wt/allergies/indication/available labs. A one time order has been placed for the above antibiotics.  Further antibiotics/pharmacy consults should be ordered by admitting physician if indicated.                       Reuel Boom, PharmD, BCPS [phone] 09/01/2017, 7:23 PM

## 2017-09-01 NOTE — ED Notes (Signed)
Patient pulled out IV x 2-striking and cursing at staff-attempted mitts and patient continues to strike at staff and try to pull IV's out after being restarted x 2-Dr. Ellender Hose aware and patient placed in soft restraints

## 2017-09-02 LAB — COMPREHENSIVE METABOLIC PANEL
ALK PHOS: 116 U/L (ref 38–126)
ALT: 21 U/L (ref 14–54)
ANION GAP: 8 (ref 5–15)
AST: 22 U/L (ref 15–41)
Albumin: 2.9 g/dL — ABNORMAL LOW (ref 3.5–5.0)
BILIRUBIN TOTAL: 1.3 mg/dL — AB (ref 0.3–1.2)
BUN: 15 mg/dL (ref 6–20)
CALCIUM: 8.2 mg/dL — AB (ref 8.9–10.3)
CO2: 23 mmol/L (ref 22–32)
Chloride: 106 mmol/L (ref 101–111)
Creatinine, Ser: 0.61 mg/dL (ref 0.44–1.00)
GFR calc non Af Amer: >60 mL/min (ref >60)
Glucose, Bld: 108 mg/dL — ABNORMAL HIGH (ref 65–99)
POTASSIUM: 3.6 mmol/L (ref 3.5–5.1)
SODIUM: 137 mmol/L (ref 135–145)
Total Protein: 6.3 g/dL — ABNORMAL LOW (ref 6.5–8.1)

## 2017-09-02 LAB — PHOSPHORUS: Phosphorus: 2.5 mg/dL (ref 2.5–4.6)

## 2017-09-02 LAB — MRSA PCR SCREENING: MRSA BY PCR: NEGATIVE

## 2017-09-02 LAB — TSH: TSH: 1.736 u[IU]/mL (ref 0.350–4.500)

## 2017-09-02 LAB — CBC
HCT: 36 % (ref 36.0–46.0)
HEMOGLOBIN: 11.7 g/dL — AB (ref 12.0–15.0)
MCH: 30.5 pg (ref 26.0–34.0)
MCHC: 32.5 g/dL (ref 30.0–36.0)
MCV: 94 fL (ref 78.0–100.0)
Platelets: 179 10*3/uL (ref 150–400)
RBC: 3.83 MIL/uL — AB (ref 3.87–5.11)
RDW: 13.5 % (ref 11.5–15.5)
WBC: 1.5 10*3/uL — ABNORMAL LOW (ref 4.0–10.5)

## 2017-09-02 LAB — MAGNESIUM: MAGNESIUM: 1.9 mg/dL (ref 1.7–2.4)

## 2017-09-02 MED ORDER — ACETAMINOPHEN 325 MG PO TABS: 650.0000 mg | ORAL_TABLET | Freq: Four times a day (QID) | ORAL | Status: DC | PRN

## 2017-09-02 MED ORDER — LORAZEPAM 2 MG/ML IJ SOLN
0.5000 mg | Freq: Four times a day (QID) | INTRAMUSCULAR | Status: DC | PRN
  Administered 2017-09-02 – 2017-09-03 (×3): 0.5 mg via INTRAVENOUS
  Filled 2017-09-02 (×3): qty 1

## 2017-09-02 MED ORDER — HYDROCERIN EX CREA
TOPICAL_CREAM | Freq: Two times a day (BID) | CUTANEOUS | Status: DC
  Administered 2017-09-02 – 2017-09-07 (×10): via TOPICAL
  Filled 2017-09-02: qty 113

## 2017-09-02 MED ORDER — ENOXAPARIN SODIUM 40 MG/0.4ML ~~LOC~~ SOLN
40.0000 mg | SUBCUTANEOUS | Status: DC
  Administered 2017-09-02 – 2017-09-07 (×6): 40 mg via SUBCUTANEOUS
  Filled 2017-09-02 (×6): qty 0.4

## 2017-09-02 MED ORDER — ONDANSETRON HCL 4 MG PO TABS: 4.0000 mg | ORAL_TABLET | Freq: Four times a day (QID) | ORAL | Status: DC | PRN

## 2017-09-02 MED ORDER — VANCOMYCIN HCL IN DEXTROSE 1-5 GM/200ML-% IV SOLN
1000.0000 mg | INTRAVENOUS | Status: DC
  Administered 2017-09-02 – 2017-09-07 (×4): 1000 mg via INTRAVENOUS
  Filled 2017-09-02 (×3): qty 200

## 2017-09-02 MED ORDER — ASPIRIN EC 81 MG PO TBEC
81.0000 mg | DELAYED_RELEASE_TABLET | Freq: Every day | ORAL | Status: DC
  Administered 2017-09-02 – 2017-09-07 (×6): 81 mg via ORAL
  Filled 2017-09-02 (×6): qty 1

## 2017-09-02 MED ORDER — ACETAMINOPHEN 650 MG RE SUPP: 650.0000 mg | Freq: Four times a day (QID) | RECTAL | Status: DC | PRN

## 2017-09-02 MED ORDER — SODIUM CHLORIDE 0.9 % IV SOLN
INTRAVENOUS | Status: DC
  Administered 2017-09-02: 02:00:00 via INTRAVENOUS

## 2017-09-02 MED ORDER — MORPHINE SULFATE (PF) 2 MG/ML IV SOLN
1.0000 mg | INTRAVENOUS | Status: DC | PRN
  Administered 2017-09-02 (×3): 2 mg via INTRAVENOUS
  Filled 2017-09-02 (×3): qty 1

## 2017-09-02 MED ORDER — ONDANSETRON HCL 4 MG/2ML IJ SOLN: 4.0000 mg | Freq: Four times a day (QID) | INTRAMUSCULAR | Status: DC | PRN

## 2017-09-02 NOTE — Progress Notes (Addendum)
Pharmacy Antibiotic Note  Jill Flowers is a 84 y.o. female admitted on 09/01/2017 with cellulitis.  Pharmacy has been consulted for vancomycin  Dosing. She has h/o venous stasis.   PCN allergy with swelling but has tolerated Cephalosporins - unable to get height and weight d/t combativeness - use weight (55kg) and Height 5' from 10/2016  Plan:  Vancomycin 1gm IV q36h  Check levels as indicated  Monitor renal function  Follow-up more recent Ht and Wt when able   Temp (24hrs), Avg:97.5 F (36.4 C), Min:97.5 F (36.4 C), Max:97.5 F (36.4 C)  Recent Labs  Lab 09/01/17 1543 09/01/17 1722  WBC 1.6*  --   CREATININE 0.69  --   LATICACIDVEN  --  1.25    CrCl cannot be calculated (Unknown ideal weight.).    Allergies  Allergen Reactions  . Dilaudid [Hydromorphone Hcl]   . Hydrocodone-Acetaminophen     REACTION: N \\T \ V  . Penicillins     REACTION: swelling Has patient had a PCN reaction causing immediate rash, facial/tongue/throat swelling, SOB or lightheadedness with hypotension: Yes Has patient had a PCN reaction causing severe rash involving mucus membranes or skin necrosis: YES Has patient had a PCN reaction that required hospitalization No Has patient had a PCN reaction occurring within the last 10 years: No If all of the above answers are "NO", then may proceed with Cephalosporin use.   . Tramadol     Antimicrobials this admission: 2/25 vanco >> 2/25 cefepime x 1  Dose adjustments this admission:  Microbiology results: 2/25 BCx:   Thank you for allowing pharmacy to be a part of this patient's care.  Doreene Eland, PharmD, BCPS.   Pager: [phone] 09/02/2017 12:24 AM

## 2017-09-02 NOTE — ED Notes (Signed)
ED TO INPATIENT HANDOFF REPORT  Name/Age/Gender Jill Flowers 84 y.o. female  Code Status    Code Status Orders  (From admission, onward)        Start     Ordered   09/02/17 0054  Full code  Continuous     09/02/17 0054    Code Status History    Date Active Date Inactive Code Status Order ID Comments User Context   10/22/2016 22:49 10/26/2016 17:43 DNR [id number]  Vianne Bulls, MD ED   01/05/2016 11:18 01/08/2016 14:21 DNR [id number]  Thurnell Lose, MD Inpatient   01/05/2016 00:46 01/05/2016 11:18 Full Code [id number]  Toy Baker, MD Inpatient      Home/SNF/Other Home  Chief Complaint wounds  Level of Care/Admitting Diagnosis ED Disposition    ED Disposition Condition [facility]: [facility] [[id number]]  Level of Care: Med-Surg [16]  Diagnosis: Cellulitis [469629]  Admitting Physician: Toy Baker [[id number]]  Attending Physician: Toy Baker [[id number]]  Estimated length of stay: 3 - 4 days  Certification:: I certify this patient will need inpatient services for at least 2 midnights  PT Class (Do Not Modify): Inpatient [101]  PT Acc Code (Do Not Modify): Private [1]       Medical History Past Medical History:  Diagnosis Date  . Cellulitis and abscess of leg 10/2016  . CHF (congestive heart failure) ([place])   . Lymphoma ([place])    "mild" per daughter  . Lymphoma ([place])    "mild" per daughter  . Spina bifida ([place])     Allergies Allergies  Allergen Reactions  . Dilaudid [Hydromorphone Hcl]   . Hydrocodone-Acetaminophen     REACTION: N \T\ V  . Penicillins     REACTION: swelling. Received cephalosporins in past Has patient had a PCN reaction causing immediate rash, facial/tongue/throat swelling, SOB or lightheadedness with hypotension: Yes Has patient had a PCN reaction causing severe rash involving mucus membranes or skin necrosis: YES Has patient had a PCN reaction that required hospitalization No Has  patient had a PCN reaction occurring within the last 10 years: No If all of the above answers are "NO", then may proceed with Cephalosporin use.   . Tramadol     IV Location/Drains/Wounds Patient Lines/Drains/Airways Status   Active Line/Drains/Airways    Name:   Placement date:   Placement time:   Site:   Days:   Peripheral IV 09/01/17 Left;Upper Arm   09/01/17    2011    Arm   1   Peripheral IV 09/01/17 Left Forearm   09/01/17    2023    Forearm   1   Pressure Ulcer 01/05/16 Stage I -  Intact skin with non-blanchable redness of a localized area usually over a bony prominence.   01/05/16    0139     606   Pressure Injury 10/23/16 Stage I -  Intact skin with non-blanchable redness of a localized area usually over a bony prominence.   10/23/16    0859     314   Wound / Incision (Open or Dehisced) 10/23/16 Non-pressure wound Thigh Bilateral boil like areas and excoriated scratch marks   10/23/16    0858    Thigh   314          Labs/Imaging Results for orders placed or performed during the hospital encounter of 09/01/17 (from the past 48 hour(s))  CBC with Differential     Status: Abnormal  Collection Time: 09/01/17  3:43 PM  Result Value Ref Range   WBC 1.6 (L) 4.0 - 10.5 K/uL   RBC 4.38 3.87 - 5.11 MIL/uL   Hemoglobin 13.3 12.0 - 15.0 g/dL   HCT 40.7 36.0 - 46.0 %   MCV 92.9 78.0 - 100.0 fL   MCH 30.4 26.0 - 34.0 pg   MCHC 32.7 30.0 - 36.0 g/dL   RDW 13.5 11.5 - 15.5 %   Platelets 186 150 - 400 K/uL   Neutrophils Relative % 64 %   Neutro Abs 1.0 (L) 1.7 - 7.7 K/uL   Lymphocytes Relative 19 %   Lymphs Abs 0.3 (L) 0.7 - 4.0 K/uL   Monocytes Relative 14 %   Monocytes Absolute 0.2 0.1 - 1.0 K/uL   Eosinophils Relative 3 %   Eosinophils Absolute 0.1 0.0 - 0.7 K/uL   Basophils Relative 0 %   Basophils Absolute 0.0 0.0 - 0.1 K/uL    Comment: Performed at [facility], [place] [address] [place], [place] [address]  Basic metabolic panel     Status: Abnormal    Collection Time: 09/01/17  3:43 PM  Result Value Ref Range   Sodium 138 135 - 145 mmol/L   Potassium 4.9 3.5 - 5.1 mmol/L   Chloride 104 101 - 111 mmol/L   CO2 25 22 - 32 mmol/L   Glucose, Bld 102 (H) 65 - 99 mg/dL   BUN 20 6 - 20 mg/dL   Creatinine, Ser 0.69 0.44 - 1.00 mg/dL   Calcium 8.6 (L) 8.9 - 10.3 mg/dL   GFR calc non Af Amer >60 >60 mL/min   GFR calc Af Amer >60 >60 mL/min    Comment: (NOTE) The eGFR has been calculated using the CKD EPI equation. This calculation has not been validated in all clinical situations. eGFR's persistently <60 mL/min signify possible Chronic Kidney Disease.    Anion gap 9 5 - 15    Comment: Performed at [facility], [place] [address], [place], [place] [address]  Urinalysis, Routine w reflex microscopic     Status: Abnormal   Collection Time: 09/01/17  3:45 PM  Result Value Ref Range   Color, Urine AMBER (A) YELLOW    Comment: BIOCHEMICALS MAY BE AFFECTED BY COLOR   APPearance TURBID (A) CLEAR   Specific Gravity, Urine 1.025 1.005 - 1.030   pH 5.0 5.0 - 8.0   Glucose, UA NEGATIVE NEGATIVE mg/dL   Hgb urine dipstick NEGATIVE NEGATIVE   Bilirubin Urine NEGATIVE NEGATIVE   Ketones, ur NEGATIVE NEGATIVE mg/dL   Protein, ur NEGATIVE NEGATIVE mg/dL   Nitrite NEGATIVE NEGATIVE   Leukocytes, UA TRACE (A) NEGATIVE   RBC / HPF 0-5 0 - 5 RBC/hpf   WBC, UA 0-5 0 - 5 WBC/hpf   Bacteria, UA NONE SEEN NONE SEEN   Squamous Epithelial / LPF 0-5 (A) NONE SEEN   Amorphous Crystal PRESENT     Comment: Performed at [facility], [place] [address] [place], [place] [address]  I-Stat CG4 Lactic Acid, ED     Status: None   Collection Time: 09/01/17  5:22 PM  Result Value Ref Range   Lactic Acid, Venous 1.25 0.5 - 1.9 mmol/L  Sedimentation rate     Status: Abnormal   Collection Time: 09/01/17  6:10 PM  Result Value Ref Range   Sed Rate 43 (H) 0 - 22 mm/hr    Comment: Performed at [facility], [place]  [address], [place], [place] [address]  C-reactive protein     Status: Abnormal   Collection Time: 09/01/17  6:10 PM  Result Value Ref Range   CRP 5.1 (H) <1.0 mg/dL    Comment: Performed at [facility] [address], [place], [place] [address]  Blood culture (routine x 2)     Status: None (Preliminary result)   Collection Time: 09/01/17  7:48 PM  Result Value Ref Range   Specimen Description      BLOOD LEFT ANTECUBITAL Performed at [place] [address], [place], [place] [address]    Special Requests      BOTTLES DRAWN AEROBIC AND ANAEROBIC Blood Culture adequate volume Performed at [place] [address] [place], [place] [address]    Culture      NO GROWTH < 24 HOURS Performed at [facility] [address] [place], [place] [address]    Report Status PENDING   Blood culture (routine x 2)     Status: None (Preliminary result)   Collection Time: 09/01/17  7:48 PM  Result Value Ref Range   Specimen Description      BLOOD RIGHT FOREARM Performed at [place] [address] [place], [place] [address]    Special Requests      BOTTLES DRAWN AEROBIC AND ANAEROBIC Blood Culture adequate volume Performed at [facility] [address], [place], [place] [address]    Culture      NO GROWTH < 24 HOURS Performed at [place] [address] [place], [place] [address]    Report Status PENDING   Magnesium     Status: None   Collection Time: 09/02/17  3:27 AM  Result Value Ref Range   Magnesium 1.9 1.7 - 2.4 mg/dL    Comment: Performed at [facility], [place] [address] [place], [place] [address]  Phosphorus     Status: None   Collection Time: 09/02/17  3:27 AM  Result Value Ref Range   Phosphorus 2.5 2.5 - 4.6 mg/dL    Comment: Performed at [facility], [place] [address], [place], [place] [address]  TSH     Status: None    Collection Time: 09/02/17  3:27 AM  Result Value Ref Range   TSH 1.736 0.350 - 4.500 uIU/mL    Comment: Performed by a 3rd Generation assay with a functional sensitivity of <=0.01 uIU/mL. Performed at [facility], [place] [address] [place], [place] [address]   Comprehensive metabolic panel     Status: Abnormal   Collection Time: 09/02/17  3:27 AM  Result Value Ref Range   Sodium 137 135 - 145 mmol/L   Potassium 3.6 3.5 - 5.1 mmol/L    Comment: DELTA CHECK NOTED REPEATED TO VERIFY NO VISIBLE HEMOLYSIS    Chloride 106 101 - 111 mmol/L   CO2 23 22 - 32 mmol/L   Glucose, Bld 108 (H) 65 - 99 mg/dL   BUN 15 6 - 20 mg/dL   Creatinine, Ser 0.61 0.44 - 1.00 mg/dL   Calcium 8.2 (L) 8.9 - 10.3 mg/dL   Total Protein 6.3 (L) 6.5 - 8.1 g/dL   Albumin 2.9 (L) 3.5 - 5.0 g/dL   AST 22 15 - 41 U/L   ALT 21 14 - 54 U/L   Alkaline Phosphatase 116 38 - 126 U/L   Total Bilirubin 1.3 (H) 0.3 - 1.2 mg/dL  GFR calc non Af Amer >60 >60 mL/min   GFR calc Af Amer >60 >60 mL/min    Comment: (NOTE) The eGFR has been calculated using the CKD EPI equation. This calculation has not been validated in all clinical situations. eGFR's persistently <60 mL/min signify possible Chronic Kidney Disease.    Anion gap 8 5 - 15    Comment: Performed at [facility], [place] [address] [place], [place] [address]  CBC     Status: Abnormal   Collection Time: 09/02/17  3:27 AM  Result Value Ref Range   WBC 1.5 (L) 4.0 - 10.5 K/uL   RBC 3.83 (L) 3.87 - 5.11 MIL/uL   Hemoglobin 11.7 (L) 12.0 - 15.0 g/dL   HCT 36.0 36.0 - 46.0 %   MCV 94.0 78.0 - 100.0 fL   MCH 30.5 26.0 - 34.0 pg   MCHC 32.5 30.0 - 36.0 g/dL   RDW 13.5 11.5 - 15.5 %   Platelets 179 150 - 400 K/uL    Comment: Performed at [facility], [place] [address], [place], [place] [address]  MRSA PCR Screening     Status: None   Collection Time: 09/02/17  3:27 AM  Result Value Ref Range   MRSA by PCR  NEGATIVE NEGATIVE    Comment:        The GeneXpert MRSA Assay (FDA approved for NASAL specimens only), is one component of a comprehensive MRSA colonization surveillance program. It is not intended to diagnose MRSA infection nor to guide or monitor treatment for MRSA infections. Performed at [facility], [place] [address] [place], [place] [address]    Dg Pelvis 1-2 Views  Result Date: 09/01/2017 CLINICAL DATA:  Multiple superficial wounds to the lower extremities EXAM: PELVIS - 1-2 VIEW COMPARISON:  Radiograph 07/05/2004 FINDINGS: SI joint degenerative changes. Pubic symphysis and rami are intact. Both femoral necks are obscured by the trochanters. No gross fracture or malalignment. No obvious bony erosive changes. IMPRESSION: No acute osseous abnormality allowing for suboptimal positioning. Electronically Signed   By: Donavan Foil M.D.   On: 09/01/2017 16:33    Pending Labs Unresulted Labs (From admission, onward)   Start     Ordered   09/03/17 0500  CBC  Tomorrow morning,   R     09/02/17 1307   09/03/17 0272  Basic metabolic panel  Tomorrow morning,   R     09/02/17 1307      Vitals/Pain Today's Vitals   09/02/17 1436 09/02/17 1551 09/02/17 1700 09/02/17 1800  BP: (!) 144/46 (!) 131/59 (!) 106/45 (!) 120/57  Pulse: 66 72 67 67  Resp: 16 18    Temp:  97.9 F (36.6 C)    TempSrc:      SpO2: 97% 100% 99% 100%  PainSc:        Isolation Precautions Airborne precautions  Medications Medications  valACYclovir (VALTREX) tablet 1,000 mg (1,000 mg Oral Given 09/02/17 1549)  aspirin EC tablet 81 mg (81 mg Oral Given 09/02/17 1122)  acetaminophen (TYLENOL) tablet 650 mg (not administered)    Or  acetaminophen (TYLENOL) suppository 650 mg (not administered)  ondansetron (ZOFRAN) tablet 4 mg (not administered)    Or  ondansetron (ZOFRAN) injection 4 mg (not administered)  enoxaparin (LOVENOX) injection 40 mg (40 mg Subcutaneous Given 09/02/17 1123)   vancomycin (VANCOCIN) IVPB 1000 mg/200 mL premix (not administered)  LORazepam (ATIVAN) injection 0.5 mg (0.5 mg Intravenous Given 09/02/17 1116)  morphine 2 MG/ML  injection 1-2 mg (2 mg Intravenous Given 09/02/17 1116)  sodium chloride 0.9 % bolus 1,000 mL (0 mLs Intravenous Stopped 09/01/17 1930)  vancomycin (VANCOCIN) IVPB 1000 mg/200 mL premix (0 mg Intravenous Stopped 09/01/17 2218)  fentaNYL (SUBLIMAZE) injection 50 mcg (50 mcg Intravenous Given 09/01/17 2218)    Mobility non-ambulatory

## 2017-09-02 NOTE — ED Notes (Signed)
Pt in mitten restraints, unable to get pulseox reading.

## 2017-09-02 NOTE — ED Notes (Signed)
Report given to RN

## 2017-09-02 NOTE — Progress Notes (Signed)
PROGRESS NOTE    MYRISSA Flowers  ZOX:[id number] DOB: 12/28/1930 DOA: 09/01/2017 PCP: Maury Dus, MD     Brief Narrative:  Jill Flowers is a 84 yo female with past medical history of dementia, history of lymphoma (not on treatment, but followed by oncology), history of spina bifida with chronic gait difficulty, chronic diastolic CHF, chronic venous stasis. She presents with skin lesions, redness and ulcerations bilateral posterior legs that extends to inner thighs. It was felt to be possible herpes zoster with superimposed purulent cellulitis. Patient has had recurrent shingles in her legs in the past. She was admitted to the hospital and started on IV vanco and valcyclovir.   Assessment & Plan:   Active Problems:   GERD   Spina bifida ([place])   COPD (chronic obstructive pulmonary disease) ([facility])   Cellulitis   Chronic diastolic CHF (congestive heart failure) ([facility])   Pressure ulcer   Skin maceration   Shingles   Leukopenia   Herpes zoster with superimposed purulent cellulitis -Blood cultures pending  -Vanco -Valcyclovir -Wound RN consult    Chronic diastolic CHF -Stable  COPD -Stable without exacerbation  Dementia with behavioral disturbance -Per daughter at bedside who resides with her, this is her baseline in the ED   DVT prophylaxis: lovenox Code Status: full Family Communication: daughter at bedside Disposition Plan: pending improvement   Consultants:   none  Procedures:   none  Antimicrobials:  Anti-infectives (From admission, onward)   Start     Dose/Rate Route Frequency Ordered Stop   09/02/17 2200  vancomycin (VANCOCIN) IVPB 1000 mg/200 mL premix     1,000 mg 200 mL/hr over 60 Minutes Intravenous Every 36 hours 09/02/17 0028     09/02/17 0300  valACYclovir (VALTREX) tablet 1,000 mg  Status:  Discontinued     1,000 mg Oral 3 times daily 09/01/17 2215 09/01/17 2218   09/01/17 2300  valACYclovir (VALTREX) tablet 1,000 mg     1,000 mg Oral 3 times  daily 09/01/17 2218 09/06/17 2159   09/01/17 1945  valACYclovir (VALTREX) tablet 500 mg  Status:  Discontinued     500 mg Oral Once 09/01/17 1939 09/01/17 2218   09/01/17 1915  vancomycin (VANCOCIN) IVPB 1000 mg/200 mL premix     1,000 mg 200 mL/hr over 60 Minutes Intravenous  Once 09/01/17 1907 09/01/17 2218        Subjective: Unable to obtain ROS due to dementia   Objective: Vitals:   09/01/17 2116 09/02/17 0036 09/02/17 0206 09/02/17 0319  BP: (!) 116/57  (!) 149/82 (!) 153/99  Pulse: 87 83  (!) 106  Resp: 17   16  Temp:      TempSrc:      SpO2: 100%   96%    Intake/Output Summary (Last 24 hours) at 09/02/2017 1302 Last data filed at 09/01/2017 1930 Gross per 24 hour  Intake 750 ml  Output -  Net 750 ml   There were no vitals filed for this visit.  Examination:  General exam: Appears agitated, not comfortable, on bilateral wrist restraints  Respiratory system: Clear to auscultation. Respiratory effort normal. Cardiovascular system: S1 & S2 heard, RRR. No JVD, murmurs, rubs, gallops or clicks. No pedal edema. Gastrointestinal system: Abdomen is nondistended, soft and nontender. No organomegaly or masses felt. Normal bowel sounds heard. Central nervous system: Alert but not oriented  Extremities: Symmetric  Skin: +bilateral buttock and posterior thighs with skin lesions, open wound, no vesicles, but with crusting and erythema  Psychiatry:  dementia   Data Reviewed: I have personally reviewed following labs and imaging studies  CBC: Recent Labs  Lab 09/01/17 1543 09/02/17 0327  WBC 1.6* 1.5*  NEUTROABS 1.0*  --   HGB 13.3 11.7*  HCT 40.7 36.0  MCV 92.9 94.0  PLT 186 191   Basic Metabolic Panel: Recent Labs  Lab 09/01/17 1543 09/02/17 0327  NA 138 137  K 4.9 3.6  CL 104 106  CO2 25 23  GLUCOSE 102* 108*  BUN 20 15  CREATININE 0.69 0.61  CALCIUM 8.6* 8.2*  MG  --  1.9  PHOS  --  2.5   GFR: CrCl cannot be calculated (Unknown ideal weight.). Liver  Function Tests: Recent Labs  Lab 09/02/17 0327  AST 22  ALT 21  ALKPHOS 116  BILITOT 1.3*  PROT 6.3*  ALBUMIN 2.9*   No results for input(s): LIPASE, AMYLASE in the last 168 hours. No results for input(s): AMMONIA in the last 168 hours. Coagulation Profile: No results for input(s): INR, PROTIME in the last 168 hours. Cardiac Enzymes: No results for input(s): CKTOTAL, CKMB, CKMBINDEX, TROPONINI in the last 168 hours. BNP (last 3 results) No results for input(s): PROBNP in the last 8760 hours. HbA1C: No results for input(s): HGBA1C in the last 72 hours. CBG: No results for input(s): GLUCAP in the last 168 hours. Lipid Profile: No results for input(s): CHOL, HDL, LDLCALC, TRIG, CHOLHDL, LDLDIRECT in the last 72 hours. Thyroid Function Tests: Recent Labs    09/02/17 0327  TSH 1.736   Anemia Panel: No results for input(s): VITAMINB12, FOLATE, FERRITIN, TIBC, IRON, RETICCTPCT in the last 72 hours. Sepsis Labs: Recent Labs  Lab 09/01/17 1722  LATICACIDVEN 1.25    Recent Results (from the past 240 hour(s))  Blood culture (routine x 2)     Status: None (Preliminary result)   Collection Time: 09/01/17  7:48 PM  Result Value Ref Range Status   Specimen Description   Final    BLOOD LEFT ANTECUBITAL Performed at [facility] [address], [place], [place] [address]    Special Requests   Final    BOTTLES DRAWN AEROBIC AND ANAEROBIC Blood Culture adequate volume Performed at [facility] [address] [place], [place] [address]    Culture   Final    NO GROWTH < 24 HOURS Performed at [facility] [address], [place], [place] [address]    Report Status PENDING  Incomplete  Blood culture (routine x 2)     Status: None (Preliminary result)   Collection Time: 09/01/17  7:48 PM  Result Value Ref Range Status   Specimen Description   Final    BLOOD RIGHT FOREARM Performed at [facility] [address] [place], [place] [address]    Special Requests   Final    BOTTLES DRAWN AEROBIC AND ANAEROBIC Blood Culture adequate volume Performed at [facility] [address], [place], [place] [address]    Culture   Final    NO GROWTH < 24 HOURS Performed at [facility] [address], [place], [place] [address]    Report Status PENDING  Incomplete  MRSA PCR Screening     Status: None   Collection Time: 09/02/17  3:27 AM  Result Value Ref Range Status   MRSA by PCR NEGATIVE NEGATIVE Final    Comment:        The GeneXpert MRSA Assay (FDA approved for NASAL  specimens only), is one component of a comprehensive MRSA colonization surveillance program. It is not intended to diagnose MRSA infection nor to guide or monitor treatment for MRSA infections. Performed at [facility], [place] [address] [place], [place] [address]        Radiology Studies: Dg Pelvis 1-2 Views  Result Date: 09/01/2017 CLINICAL DATA:  Multiple superficial wounds to the lower extremities EXAM: PELVIS - 1-2 VIEW COMPARISON:  Radiograph 07/05/2004 FINDINGS: SI joint degenerative changes. Pubic symphysis and rami are intact. Both femoral necks are obscured by the trochanters. No gross fracture or malalignment. No obvious bony erosive changes. IMPRESSION: No acute osseous abnormality allowing for suboptimal positioning. Electronically Signed   By: Donavan Foil M.D.   On: 09/01/2017 16:33      Scheduled Meds: . aspirin EC  81 mg Oral Daily  . enoxaparin (LOVENOX) injection  40 mg Subcutaneous Q24H  . valACYclovir  1,000 mg Oral TID   Continuous Infusions: . vancomycin       LOS: 1 day    Time spent: 40 minutes   Dessa Phi, DO [facility] [email or web address] Password [email or web address] 09/02/2017, 1:02 PM

## 2017-09-02 NOTE — ED Notes (Signed)
Gave report to Josph Macho, Therapist, sports for [phone].

## 2017-09-02 NOTE — Progress Notes (Signed)
This encounter was created in error - please disregard.

## 2017-09-02 NOTE — Consult Note (Signed)
[facility] Nurse wound consult note Reason for Consult: patient with lesions consistent with herpes zoster (shingles) according to admitting MD after consideration of past medical history of posterior LE, left buttock outbreak. Suspect supra infection (cellulitis) Wound type:infectious (viral) Pressure Injury POA: NA Measurement:largest open lesion is on left lateral LE and measures 3cm x 1.5cm x 0.2cm. This appears to be two lesions joined together. Wound bed:red, moist Drainage (amount, consistency, odor) serous Periwound:intact, dryDressing procedure/placement/frequency: Patient is placed on a mattress replacement with low air loss feature for comfort.  An external powered female urinary continence device (PurWick) is in place. Systemic antiviral and antibiotic is in place. Topical care will consist of pressure redistribution heel boots for her feet and application of a moisture barrier to the lesions.  [place] nursing team will not follow, but will remain available to this patient, the nursing and medical teams.  Please re-consult if needed. Thanks, Maudie Flakes, MSN, RN, [place], Arther Abbott  Pager# [phone]

## 2017-09-03 ENCOUNTER — Other Ambulatory Visit: Payer: Self-pay

## 2017-09-03 DIAGNOSIS — L03116 Cellulitis of left lower limb: Principal | ICD-10-CM

## 2017-09-03 DIAGNOSIS — L03818 Cellulitis of other sites: Secondary | ICD-10-CM

## 2017-09-03 DIAGNOSIS — D709 Neutropenia, unspecified: Secondary | ICD-10-CM

## 2017-09-03 DIAGNOSIS — D72819 Decreased white blood cell count, unspecified: Secondary | ICD-10-CM

## 2017-09-03 DIAGNOSIS — L03115 Cellulitis of right lower limb: Secondary | ICD-10-CM

## 2017-09-03 LAB — GLUCOSE, CAPILLARY
GLUCOSE-CAPILLARY: 85 mg/dL (ref 65–99)
GLUCOSE-CAPILLARY: 103 mg/dL — AB (ref 65–99)
Glucose-Capillary: 95 mg/dL (ref 65–99)

## 2017-09-03 LAB — BASIC METABOLIC PANEL
Anion gap: 9 (ref 5–15)
BUN: 9 mg/dL (ref 6–20)
CHLORIDE: 102 mmol/L (ref 101–111)
CO2: 25 mmol/L (ref 22–32)
CREATININE: 0.61 mg/dL (ref 0.44–1.00)
Calcium: 8.7 mg/dL — ABNORMAL LOW (ref 8.9–10.3)
Glucose, Bld: 105 mg/dL — ABNORMAL HIGH (ref 65–99)
POTASSIUM: 3.8 mmol/L (ref 3.5–5.1)
SODIUM: 136 mmol/L (ref 135–145)

## 2017-09-03 LAB — DIFFERENTIAL
BASOS PCT: 0 %
Basophils Absolute: 0 10*3/uL (ref 0.0–0.1)
Eosinophils Absolute: 0.1 10*3/uL (ref 0.0–0.7)
Eosinophils Relative: 7 %
LYMPHS PCT: 39 %
Lymphs Abs: 0.4 10*3/uL — ABNORMAL LOW (ref 0.7–4.0)
MONOS PCT: 34 %
Monocytes Absolute: 0.3 10*3/uL (ref 0.1–1.0)
NEUTROS ABS: 0.2 10*3/uL — AB (ref 1.7–7.7)
Neutrophils Relative %: 20 %

## 2017-09-03 LAB — CBC
HCT: 40.2 % (ref 36.0–46.0)
HEMATOCRIT: 40 % (ref 36.0–46.0)
Hemoglobin: 13.1 g/dL (ref 12.0–15.0)
Hemoglobin: 13.2 g/dL (ref 12.0–15.0)
MCH: 30.5 pg (ref 26.0–34.0)
MCH: 30.3 pg (ref 26.0–34.0)
MCHC: 32.8 g/dL (ref 30.0–36.0)
MCHC: 32.8 g/dL (ref 30.0–36.0)
MCV: 93.2 fL (ref 78.0–100.0)
MCV: 92.2 fL (ref 78.0–100.0)
Platelets: 201 10*3/uL (ref 150–400)
Platelets: 204 10*3/uL (ref 150–400)
RBC: 4.29 MIL/uL (ref 3.87–5.11)
RBC: 4.36 MIL/uL (ref 3.87–5.11)
RDW: 13.5 % (ref 11.5–15.5)
RDW: 13.3 % (ref 11.5–15.5)
WBC: 1.1 10*3/uL — CL (ref 4.0–10.5)
WBC: 1 10*3/uL — AB (ref 4.0–10.5)

## 2017-09-03 MED ORDER — VALACYCLOVIR HCL 500 MG PO TABS
1000.0000 mg | ORAL_TABLET | Freq: Two times a day (BID) | ORAL | Status: DC
  Administered 2017-09-03 – 2017-09-07 (×9): 1000 mg via ORAL
  Filled 2017-09-03 (×11): qty 2

## 2017-09-03 MED ORDER — MORPHINE SULFATE (PF) 2 MG/ML IV SOLN
1.0000 mg | INTRAVENOUS | Status: DC | PRN
  Administered 2017-09-03: 1 mg via INTRAVENOUS
  Filled 2017-09-03 (×2): qty 1

## 2017-09-03 MED ORDER — HALOPERIDOL LACTATE 5 MG/ML IJ SOLN
1.0000 mg | Freq: Four times a day (QID) | INTRAMUSCULAR | Status: DC | PRN
  Administered 2017-09-05: 1 mg via INTRAVENOUS
  Filled 2017-09-03: qty 1

## 2017-09-03 MED ORDER — OXYCODONE HCL 5 MG PO TABS
2.5000 mg | ORAL_TABLET | ORAL | Status: DC | PRN
  Administered 2017-09-03 – 2017-09-04 (×2): 2.5 mg via ORAL
  Filled 2017-09-03 (×2): qty 1

## 2017-09-03 MED ORDER — MORPHINE SULFATE (PF) 2 MG/ML IV SOLN
1.0000 mg | Freq: Four times a day (QID) | INTRAVENOUS | Status: DC | PRN
  Administered 2017-09-04 (×2): 1 mg via INTRAVENOUS
  Filled 2017-09-03 (×2): qty 1

## 2017-09-03 NOTE — Progress Notes (Addendum)
Daughter at bedside all day. Pt turned and repositioned q 3 hr when pt would let us. Pt screamed when touched because of pain.

## 2017-09-03 NOTE — Progress Notes (Signed)
PROGRESS NOTE    Jill Flowers  UMP:[id number] DOB: February 21, 1931 DOA: 09/01/2017 PCP: Maury Dus, MD   Brief Narrative:  Jill Flowers is Jill Flowers 84 yo female with past medical history of dementia, history of lymphoma (not on treatment, but followed by oncology), history of spina bifida with chronic gait difficulty, chronic diastolic CHF, chronic venous stasis. She presents with skin lesions, redness and ulcerations bilateral posterior legs that extends to inner thighs. It was felt to be possible herpes zoster with superimposed purulent cellulitis. Patient has had recurrent shingles in her legs in the past. She was admitted to the hospital and started on IV vanco and valcyclovir.   Assessment & Plan:   Principal Problem:   Cellulitis Active Problems:   GERD   Spina bifida ([place])   COPD (chronic obstructive pulmonary disease) (HCC)   Chronic diastolic CHF (congestive heart failure) (HCC)   Pressure ulcer   Skin maceration   Shingles   Leukopenia   Herpes zoster with superimposed purulent cellulitis - continued extensive erythema to buttocks and down bilateral thighs, no areas of fluctuance on my exam - Blood cultures pending  -Continue vancomycin -Valcyclovir -Wound RN consult  - oxycodone prn pain (morphine for breakthrough)   Leukopenia: repeat labs with differential.  Could be related to medications, but this isn't new, it was as low as 1.1 at her last hospitalization on discharge.  Chronic diastolic CHF -Stable   COPD -Stable without exacerbation  Dementia with behavioral disturbance -This morning seems sleepier, not at baseline per daughter, likely related to ativan she received this morning.  Will d/c ativan for now.  - delirium precautions - follow EKG, consider adding prn haldol if continued delirium superimposed on dementia  History of Spina Bifida: she was walking with [employer] walker last year, but sounds like she's declined since hospitalization in April of 2018.  Her  daughter notes she hasn't been walking since then.  PT/OT ordered.    DVT prophylaxis: lovenox Code Status: full  Family Communication: daughter at bedside Disposition Plan: pending   Consultants:   none  Procedures:   none  Antimicrobials:  Anti-infectives (From admission, onward)   Start     Dose/Rate Route Frequency Ordered Stop   09/03/17 1200  valACYclovir (VALTREX) tablet 1,000 mg     1,000 mg Oral 2 times daily 09/03/17 1131 09/08/17 2159   09/02/17 2200  vancomycin (VANCOCIN) IVPB 1000 mg/200 mL premix     1,000 mg 200 mL/hr over 60 Minutes Intravenous Every 36 hours 09/02/17 0028     09/02/17 0300  valACYclovir (VALTREX) tablet 1,000 mg  Status:  Discontinued     1,000 mg Oral 3 times daily 09/01/17 2215 09/01/17 2218   09/01/17 2300  valACYclovir (VALTREX) tablet 1,000 mg  Status:  Discontinued     1,000 mg Oral 3 times daily 09/01/17 2218 09/03/17 1131   09/01/17 1945  valACYclovir (VALTREX) tablet 500 mg  Status:  Discontinued     500 mg Oral Once 09/01/17 1939 09/01/17 2218   09/01/17 1915  vancomycin (VANCOCIN) IVPB 1000 mg/200 mL premix     1,000 mg 200 mL/hr over 60 Minutes Intravenous  Once 09/01/17 1907 09/01/17 2218       Subjective: Karanveer Ramakrishnan&Ox1 Pain with movement.   Objective: Vitals:   09/02/17 1700 09/02/17 1800 09/02/17 1934 09/03/17 0523  BP: (!) 106/45 (!) 120/57 138/63 (!) 149/53  Pulse: 67 67 80 75  Resp:   18 18  Temp:   98 F (  36.7 C) 97.9 F (36.6 C)  TempSrc:   Axillary Axillary  SpO2: 99% 100% 100% 100%  Weight:   56.9 kg (125 lb 7.1 oz)   Height:   4' 11.84" (1.52 m)     Intake/Output Summary (Last 24 hours) at 09/03/2017 1125 Last data filed at 09/03/2017 0528 Gross per 24 hour  Intake 1200 ml  Output 450 ml  Net 750 ml   Filed Weights   09/02/17 1934  Weight: 56.9 kg (125 lb 7.1 oz)    Examination:  General exam: Appears calm and comfortable, but very uncomfortable with movement Respiratory system: Clear to auscultation.  Respiratory effort normal. Cardiovascular system: S1 & S2 heard, RRR. No JVD, murmurs, rubs, gallops or clicks. No pedal edema. Gastrointestinal system: Abdomen is nondistended, soft and nontender. No organomegaly or masses felt. Normal bowel sounds heard. Central nervous system: Alert and oriented x 1. CN 2-12 grossly intact.  Extremities: no LEE bilaterally Skin: Erythema to buttocks bilaterally with crusting ulcerations.  No visible vesicles.     Data Reviewed: I have personally reviewed following labs and imaging studies  CBC: Recent Labs  Lab 09/01/17 1543 09/02/17 0327 09/03/17 0350  WBC 1.6* 1.5* 1.1*  NEUTROABS 1.0*  --   --   HGB 13.3 11.7* 13.1  HCT 40.7 36.0 40.0  MCV 92.9 94.0 93.2  PLT 186 179 528   Basic Metabolic Panel: Recent Labs  Lab 09/01/17 1543 09/02/17 0327 09/03/17 0350  NA 138 137 136  K 4.9 3.6 3.8  CL 104 106 102  CO2 25 23 25   GLUCOSE 102* 108* 105*  BUN 20 15 9   CREATININE 0.69 0.61 0.61  CALCIUM 8.6* 8.2* 8.7*  MG  --  1.9  --   PHOS  --  2.5  --    GFR: Estimated Creatinine Clearance: 40.4 mL/min (by C-G formula based on SCr of 0.61 mg/dL). Liver Function Tests: Recent Labs  Lab 09/02/17 0327  AST 22  ALT 21  ALKPHOS 116  BILITOT 1.3*  PROT 6.3*  ALBUMIN 2.9*   No results for input(s): LIPASE, AMYLASE in the last 168 hours. No results for input(s): AMMONIA in the last 168 hours. Coagulation Profile: No results for input(s): INR, PROTIME in the last 168 hours. Cardiac Enzymes: No results for input(s): CKTOTAL, CKMB, CKMBINDEX, TROPONINI in the last 168 hours. BNP (last 3 results) No results for input(s): PROBNP in the last 8760 hours. HbA1C: No results for input(s): HGBA1C in the last 72 hours. CBG: Recent Labs  Lab 09/03/17 0805 09/03/17 1112  GLUCAP 85 103*   Lipid Profile: No results for input(s): CHOL, HDL, LDLCALC, TRIG, CHOLHDL, LDLDIRECT in the last 72 hours. Thyroid Function Tests: Recent Labs     09/02/17 0327  TSH 1.736   Anemia Panel: No results for input(s): VITAMINB12, FOLATE, FERRITIN, TIBC, IRON, RETICCTPCT in the last 72 hours. Sepsis Labs: Recent Labs  Lab 09/01/17 1722  LATICACIDVEN 1.25    Recent Results (from the past 240 hour(s))  Blood culture (routine x 2)     Status: None (Preliminary result)   Collection Time: 09/01/17  7:48 PM  Result Value Ref Range Status   Specimen Description   Final    BLOOD LEFT ANTECUBITAL Performed at [facility] [address], [place], [place] [address]    Special Requests   Final    BOTTLES DRAWN AEROBIC AND ANAEROBIC Blood Culture adequate volume Performed at [facility] [address] [place],  [place] [address]    Culture   Final    NO GROWTH 2 DAYS Performed at [facility], [place] [address], [place], [place] [address]    Report Status PENDING  Incomplete  Blood culture (routine x 2)     Status: None (Preliminary result)   Collection Time: 09/01/17  7:48 PM  Result Value Ref Range Status   Specimen Description   Final    BLOOD RIGHT FOREARM Performed at [facility] [address] [place], [place] [address]    Special Requests   Final    BOTTLES DRAWN AEROBIC AND ANAEROBIC Blood Culture adequate volume Performed at [facility] [address] [place], [place] [address]    Culture   Final    NO GROWTH 2 DAYS Performed at [facility] [address], [place], [place] [address]    Report Status PENDING  Incomplete  MRSA PCR Screening     Status: None   Collection Time: 09/02/17  3:27 AM  Result Value Ref Range Status   MRSA by PCR NEGATIVE NEGATIVE Final    Comment:        The GeneXpert MRSA Assay (FDA approved for NASAL specimens only), is one component of Rhylan Gross comprehensive MRSA colonization surveillance program. It is not intended to diagnose MRSA infection nor to guide or monitor treatment for MRSA  infections. Performed at [facility], [place] [address] [place], [place] [address]          Radiology Studies: Dg Pelvis 1-2 Views  Result Date: 09/01/2017 CLINICAL DATA:  Multiple superficial wounds to the lower extremities EXAM: PELVIS - 1-2 VIEW COMPARISON:  Radiograph 07/05/2004 FINDINGS: SI joint degenerative changes. Pubic symphysis and rami are intact. Both femoral necks are obscured by the trochanters. No gross fracture or malalignment. No obvious bony erosive changes. IMPRESSION: No acute osseous abnormality allowing for suboptimal positioning. Electronically Signed   By: Donavan Foil M.D.   On: 09/01/2017 16:33        Scheduled Meds: . aspirin EC  81 mg Oral Daily  . enoxaparin (LOVENOX) injection  40 mg Subcutaneous Q24H  . hydrocerin   Topical BID  . valACYclovir  1,000 mg Oral TID   Continuous Infusions: . vancomycin Stopped (09/03/17 0000)     LOS: 2 days    Time spent: over 30 min    Fayrene Helper, MD [facility] Pager [phone]  If 7PM-7AM, please contact night-coverage [email or web address] Password [facility] 09/03/2017, 11:25 AM

## 2017-09-03 NOTE — Progress Notes (Signed)
CRITICAL VALUE ALERT  Critical Value: WBC 1.1  Date & Time Notified: 09/03/2017  7618  Provider Notified; Tylene Fantasia, NP  Orders Received: Provider to review and address

## 2017-09-03 NOTE — Progress Notes (Signed)
PT Cancellation Note  Patient Details Name: Jill Flowers MRN: [id number] DOB: 11/27/1930   Cancelled Treatment:    Reason Eval/Treat Not Completed: Fatigue/lethargy limiting ability to participate  Daughter reports that patient is lethargic from medication. Daughter reports that patient requires total care of 2, sleeps in lift chair, is  Nonambulatory.  HAS LIVE IN  PERSON TO ASSIST with care. [facility]  NEEDS APPEAR MINIMAL AT THIS TIME. WILL CHECK BACK ANOTHER TIME.  Claretha Cooper 09/03/2017, 9:41 AM  Tresa Endo PT [phone]

## 2017-09-03 NOTE — Progress Notes (Signed)
OT Cancellation Note  Patient Details Name: Jill Flowers MRN: [id number] DOB: 05/07/1931   Cancelled Treatment:    Reason Eval/Treat Not Completed: Other (comment)  Spoke with OT.  Noted pt total A with ADL activity. Will sign off. Kari Baars, [place]  Payton Mccallum D 09/03/2017, 10:46 AM

## 2017-09-03 NOTE — Consult Note (Signed)
Reason for Referral: Leukocytopenia.  HPI: 84 year old woman with a history of congestive heart failure, dementia and chronic pressure ulcers.  She has history of chronic leukocytopenia and neutropenia have been seen by Dr. Jana Hakim with this issue in the past.  No treatment needed in the past.  She was hospitalized on 09/01/2017 with a cellulitis and possible abscess of the leg and possible recurrent herpes zoster.  He was started on vancomycin as well as acyclovir.  Her white cell count was noted to be close to her baseline with total white cell count of 1.0-1.6 range with normal neutrophil percentage on admission of 64%.  Repeat CBC on 09/03/2017 showed a white cell count of 1.0, hemoglobin of 13.2 with a platelet count of 204.  Her neutrophil percentage her down to 20% and lymphocytes were 39%.  Her absolute neutrophil percentage was 200.  She denies any fevers, chills or sweats.  And according to her daughter the provided a lot of the history, she is slightly more lethargic.  Review of system was difficult to obtain.  She does not report any syncope or seizures. Does not report any cough, wheezing or hemoptysis.  Does not report any chest pain, palpitation, orthopnea.  Does not report any nausea, vomiting or abdominal pain.  Does not report any constipation or diarrhea.  Does not report frequency, urgency or hematuria. Does not report any lymphadenopathy or petechiae.  Does not report any anxiety or depression.  Remaining review of systems is negative.      Past Medical History:  Diagnosis Date  . Cellulitis and abscess of leg 10/2016  . CHF (congestive heart failure) ([place])   . Lymphoma ([place])    "mild" per daughter  . Lymphoma ([place])    "mild" per daughter  . Spina bifida [facility])   :  Past Surgical History:  Procedure Laterality Date  . ABDOMINAL HYSTERECTOMY    . KNEE SURGERY    . spinda bifida     when she was 84 yo  :   Current Facility-Administered Medications:  .  acetaminophen  (TYLENOL) tablet 650 mg, 650 mg, Oral, Q6H PRN **OR** acetaminophen (TYLENOL) suppository 650 mg, 650 mg, Rectal, Q6H PRN, Doutova, Anastassia, MD .  aspirin EC tablet 81 mg, 81 mg, Oral, Daily, Doutova, Anastassia, MD, 81 mg at 09/03/17 1144 .  enoxaparin (LOVENOX) injection 40 mg, 40 mg, Subcutaneous, Q24H, Doutova, Anastassia, MD, 40 mg at 09/03/17 1144 .  hydrocerin (EUCERIN) cream, , Topical, BID, Dessa Phi, DO .  morphine 2 MG/ML injection 1 mg, 1 mg, Intravenous, Q4H PRN, Elodia Florence., MD, 1 mg at 09/03/17 1243 .  ondansetron (ZOFRAN) tablet 4 mg, 4 mg, Oral, Q6H PRN **OR** ondansetron (ZOFRAN) injection 4 mg, 4 mg, Intravenous, Q6H PRN, Doutova, Anastassia, MD .  oxyCODONE (Oxy IR/ROXICODONE) immediate release tablet 2.5 mg, 2.5 mg, Oral, Q4H PRN, Elodia Florence., MD .  valACYclovir (VALTREX) tablet 1,000 mg, 1,000 mg, Oral, BID, Elodia Florence., MD, 1,000 mg at 09/03/17 1143 .  vancomycin (VANCOCIN) IVPB 1000 mg/200 mL premix, 1,000 mg, Intravenous, Q36H, Berton Mount, RPH, Stopped at 09/03/17 0000:  Allergies  Allergen Reactions  . Dilaudid [Hydromorphone Hcl]   . Hydrocodone-Acetaminophen     REACTION: N \\T \ V  . Penicillins     REACTION: swelling. Received cephalosporins in past Has patient had a PCN reaction causing immediate rash, facial/tongue/throat swelling, SOB or lightheadedness with hypotension: Yes Has patient had a PCN reaction causing severe rash involving  mucus membranes or skin necrosis: YES Has patient had a PCN reaction that required hospitalization No Has patient had a PCN reaction occurring within the last 10 years: No If all of the above answers are "NO", then may proceed with Cephalosporin use.   . Tramadol   :  Family History  Problem Relation Age of Onset  . Cancer Father   . Cancer Sister   . Stroke Brother   :  Social History   Socioeconomic History  . Marital status: Widowed    Spouse name: Not on file  .  Number of children: Not on file  . Years of education: Not on file  . Highest education level: Not on file  Social Needs  . Financial resource strain: Not on file  . Food insecurity - worry: Not on file  . Food insecurity - inability: Not on file  . Transportation needs - medical: Not on file  . Transportation needs - non-medical: Not on file  Occupational History  . Not on file  Tobacco Use  . Smoking status: Never Smoker  . Smokeless tobacco: Never Used  Substance and Sexual Activity  . Alcohol use: No  . Drug use: No  . Sexual activity: Not on file  Other Topics Concern  . Not on file  Social History Narrative   Lives with husband and has a granddaughter that stays with her  :  Pertinent items are noted in HPI.  Exam: Blood pressure (!) 106/49, pulse 76, temperature 98 F (36.7 C), temperature source Axillary, resp. rate 18, height 4' 11.84" (1.52 m), weight 125 lb 7.1 oz (56.9 kg), SpO2 100 %. General appearance: Lethargic but arousable chronically ill-appearing woman.  Not in any distress. Head: atraumatic without any abnormalities. Eyes: conjunctivae/corneas clear. PERRL.  Sclera anicteric. Throat: lips, mucosa, and tongue normal; without oral thrush or ulcers. Resp: clear to auscultation bilaterally without rhonchi, wheezes or dullness to percussion. Cardio: regular rate and rhythm, S1, S2 normal, no murmur, click, rub or gallop GI: soft, non-tender; bowel sounds normal; no masses,  no organomegaly Skin: Erythema noted on her lower extremities bilaterally.   Lymph nodes: Cervical, supraclavicular, and axillary nodes normal. Neurologic: Grossly normal without any motor, sensory or deep tendon reflexes.  Musculoskeletal: No joint deformity or effusion.  Recent Labs    09/03/17 0350 09/03/17 1334  WBC 1.1* 1.0*  HGB 13.1 13.2  HCT 40.0 40.2  PLT 201 204   Recent Labs    09/02/17 0327 09/03/17 0350  NA 137 136  K 3.6 3.8  CL 106 102  CO2 23 25  GLUCOSE  108* 105*  BUN 15 9  CREATININE 0.61 0.61  CALCIUM 8.2* 8.7*     Dg Pelvis 1-2 Views  Result Date: 09/01/2017 CLINICAL DATA:  Multiple superficial wounds to the lower extremities EXAM: PELVIS - 1-2 VIEW COMPARISON:  Radiograph 07/05/2004 FINDINGS: SI joint degenerative changes. Pubic symphysis and rami are intact. Both femoral necks are obscured by the trochanters. No gross fracture or malalignment. No obvious bony erosive changes. IMPRESSION: No acute osseous abnormality allowing for suboptimal positioning. Electronically Signed   By: Donavan Foil M.D.   On: 09/01/2017 16:33    Assessment and Plan:   84 year old woman with the following issues:  1.  Leukocytopenia: This is a chronic finding with fluctuating neutropenia that that has been noted for more than 10 years.  Her neutrophil percentage fluctuated in the past and similar pattern without any intervention or clear-cut pathology identified.  Chronic  benign neutropenia is likely the etiology of these findings.  I see no evidence to suggest lymphoproliferative disorder.  Her peripheral smear did not show any abnormalities at this time to indicate hematological condition.  From a management standpoint, I do not recommend any growth factor support at this time unless clear signs of sepsis has been identified.  If she becomes hemodynamically unstable with clear evidence of infection, then growth factor support may be considered at the time.  Supportive management as you are doing is recommended.  2.  Cellulitis: I agree with the current management with intravenous antibiotics as you are doing.  55  minutes was spent with the patient face-to-face today.  More than 50% of time was dedicated to patient counseling, education and reviewing her laboratory testing for the last 10 years.

## 2017-09-04 LAB — COMPREHENSIVE METABOLIC PANEL
ALT: 24 U/L (ref 14–54)
AST: 28 U/L (ref 15–41)
Albumin: 2.9 g/dL — ABNORMAL LOW (ref 3.5–5.0)
Alkaline Phosphatase: 151 U/L — ABNORMAL HIGH (ref 38–126)
Anion gap: 7 (ref 5–15)
BILIRUBIN TOTAL: 1.9 mg/dL — AB (ref 0.3–1.2)
BUN: 13 mg/dL (ref 6–20)
CHLORIDE: 100 mmol/L — AB (ref 101–111)
CO2: 26 mmol/L (ref 22–32)
CREATININE: 0.69 mg/dL (ref 0.44–1.00)
Calcium: 8.5 mg/dL — ABNORMAL LOW (ref 8.9–10.3)
GFR calc Af Amer: >60 mL/min (ref >60)
Glucose, Bld: 112 mg/dL — ABNORMAL HIGH (ref 65–99)
Potassium: 3.7 mmol/L (ref 3.5–5.1)
Sodium: 133 mmol/L — ABNORMAL LOW (ref 135–145)
Total Protein: 6.8 g/dL (ref 6.5–8.1)

## 2017-09-04 LAB — CBC WITH DIFFERENTIAL/PLATELET
Basophils Absolute: 0 10*3/uL (ref 0.0–0.1)
Basophils Relative: 1 %
Eosinophils Absolute: 0.1 10*3/uL (ref 0.0–0.7)
Eosinophils Relative: 6 %
HCT: 38.8 % (ref 36.0–46.0)
HEMOGLOBIN: 13 g/dL (ref 12.0–15.0)
LYMPHS ABS: 0.5 10*3/uL — AB (ref 0.7–4.0)
LYMPHS PCT: 38 %
MCH: 30.9 pg (ref 26.0–34.0)
MCHC: 33.5 g/dL (ref 30.0–36.0)
MCV: 92.2 fL (ref 78.0–100.0)
MONOS PCT: 37 %
Monocytes Absolute: 0.5 10*3/uL (ref 0.1–1.0)
NEUTROS ABS: 0.3 10*3/uL — AB (ref 1.7–7.7)
NEUTROS PCT: 18 %
PLATELETS: 201 10*3/uL (ref 150–400)
RBC: 4.21 MIL/uL (ref 3.87–5.11)
RDW: 13.3 % (ref 11.5–15.5)
WBC: 1.4 10*3/uL — AB (ref 4.0–10.5)

## 2017-09-04 LAB — MAGNESIUM: Magnesium: 2 mg/dL (ref 1.7–2.4)

## 2017-09-04 LAB — PATHOLOGIST SMEAR REVIEW

## 2017-09-04 MED ORDER — GABAPENTIN 100 MG PO CAPS
100.0000 mg | ORAL_CAPSULE | Freq: Every day | ORAL | Status: DC
  Administered 2017-09-04 – 2017-09-06 (×3): 100 mg via ORAL
  Filled 2017-09-04 (×3): qty 1

## 2017-09-04 MED ORDER — ACETAMINOPHEN 500 MG PO TABS
1000.0000 mg | ORAL_TABLET | Freq: Three times a day (TID) | ORAL | Status: DC
  Administered 2017-09-04 – 2017-09-07 (×9): 1000 mg via ORAL
  Filled 2017-09-04 (×9): qty 2

## 2017-09-04 MED ORDER — MORPHINE SULFATE (PF) 2 MG/ML IV SOLN
1.0000 mg | INTRAVENOUS | Status: DC | PRN
  Administered 2017-09-04 – 2017-09-06 (×4): 1 mg via INTRAVENOUS
  Filled 2017-09-04 (×5): qty 1

## 2017-09-04 MED ORDER — OXYCODONE HCL 5 MG PO TABS
5.0000 mg | ORAL_TABLET | ORAL | Status: DC | PRN
  Administered 2017-09-04 – 2017-09-07 (×10): 5 mg via ORAL
  Filled 2017-09-04 (×10): qty 1

## 2017-09-04 NOTE — Clinical Social Work Note (Signed)
Clinical Social Work Assessment  Patient Details  Name: Jill Flowers MRN: [id number] Date of Birth: [birth year]  Date of referral:  09/04/17               Reason for consult:  Discharge Planning                Permission sought to share information with:  Family Supports Permission granted to share information::     Name::     daughter Jill Flowers  Agency::     Relationship::     Contact Information:     Housing/Transportation Living arrangements for the past 2 months:  Single Family Home Source of Information:  Adult Children Patient Interpreter Needed:  None Criminal Activity/Legal Involvement Pertinent to Current Situation/Hospitalization:  No - Comment as needed Significant Relationships:  Adult Children, Community Support Lives with:  Adult Children Do you feel safe going back to the place where you live?  Yes Need for family participation in patient care:  Yes (Comment)(daughter very involved- is primary caretaker)  Care giving concerns:  Pt resides at home with her adult daughter who is her primary caretaker. Has spina bifida and is non-ambulatory per daughter. "Can sit up in a chair but that's about it, we take care of her completely." States she has hired 24/7 caretakers at home to assist her.  Pt also has dementia and is disoriented at baseline per daughter.    Social Worker assessment / plan:  CSW consulted to assess DC planning needs. Unable to converse with pt- see description of her presentation. Daughter engaged in assessment very briefly to provide description of care needs at home above. Did not discuss pt's history. Daughter states, "Right now we have everything we need at home for her." States this includes equipment and 24/7 caregivers but otherwise does not expound. Daughter states she does not have home health but would be open to this if needed at DC.  Denies other needs.   Plan: Per daughter plan to return home when stable. Denies care needs but open to  discussing if needs arise.  Employment status:  Disabled (Comment on whether or not currently receiving Disability) Insurance information:  Managed Medicare PT Recommendations:  Not assessed at this time(pt has not been able to participate) Information / Referral to community resources:     Patient/Family's Response to care:  Minimal interaction- no remarkable response noted  Patient/Family's Understanding of and Emotional Response to Diagnosis, Current Treatment, and Prognosis:  Unable to determine pt's level of understanding based on description of her cognition. Emotional response has been to scream and resist care.  Daughter demonstrates some understanding but interaction was very brief and minimal and did not discuss specifics of treatment here or of care at home with CSW. No remarkable emotional reaction.   Emotional Assessment Appearance:  Appears stated age Attitude/Demeanor/Rapport:  (UTA- not engaged in assessment but per notes as been screaming when touched) Affect (typically observed):  (UTA) Orientation:  (disoriented per staff interactions) Alcohol / Substance use:  Not Applicable Psych involvement (Current and /or in the community):  No (Comment)  Discharge Needs  Concerns to be addressed:  (denies concerns) Readmission within the last 30 days:  No Current discharge risk:  None Barriers to Discharge:  Continued Medical Work up   [employer], LCSW 09/04/2017, 2:16 PM  [phone]

## 2017-09-04 NOTE — Progress Notes (Signed)
PT Cancellation Note  Patient Details Name: Jill Flowers MRN: [id number] DOB: [birth year]   Cancelled Treatment:    Reason Eval/Treat Not Completed: Pain limiting ability to participate, crying out, RN has been notified by IV RN. Not able to participate at this time. Will evaluate  As patient able to participate. Patient required total care PTA.    Claretha Cooper 09/04/2017, 8:46 AM Tresa Endo PT [phone]

## 2017-09-04 NOTE — Progress Notes (Signed)
PROGRESS NOTE    Jill Flowers  BZJ:[id number] DOB: 1930/10/04 DOA: 09/01/2017 PCP: Maury Dus, MD   Brief Narrative:  Jill Flowers is Michio Thier 84 yo female with past medical history of dementia, history of lymphoma (not on treatment, but followed by oncology), history of spina bifida with chronic gait difficulty, chronic diastolic CHF, chronic venous stasis. She presents with skin lesions, redness and ulcerations bilateral posterior legs that extends to inner thighs. It was felt to be possible herpes zoster with superimposed purulent cellulitis. Patient has had recurrent shingles in her legs in the past. She was admitted to the hospital and started on IV vanco and valcyclovir.   Assessment & Plan:   Principal Problem:   Cellulitis Active Problems:   GERD   Spina bifida ([place])   COPD (chronic obstructive pulmonary disease) (HCC)   Chronic diastolic CHF (congestive heart failure) (HCC)   Pressure ulcer   Skin maceration   Shingles   Leukopenia   Herpes zoster with superimposed purulent cellulitis - continued extensive erythema to buttocks and down bilateral thighs, no areas of fluctuance on my exam, but there was purulence (noted on L hip) - Blood cultures pending  - Continue vancomycin (2/25 - ) - Valcyclovir (2/25 - ) - Wound RN consult, appreciate recs - oxycodone prn pain (morphine for breakthrough).  Scheduled APAP.  Start gabapentin.    Leukopenia  Neutropenia: Appreciate oncology recommendations.  Suspect chronic benign neutropenia.  Continue supportive management.  Growth factor only if clear signs of sepsis.  Chronic diastolic CHF -Stable   COPD -Stable without exacerbation  Dementia with behavioral disturbance - delirium precautions - follow EKG (normal qtc) - prn haldol ordered for agitation  History of Spina Bifida: she was walking with Jill Digilio walker last year, but sounds like she's declined since hospitalization in April of 2018.  Her daughter notes she hasn't been  walking since then.  PT/OT ordered.    Elevated alk phos  bili: mild, follow   DVT prophylaxis: lovenox Code Status: full  Family Communication: daughter at bedside Disposition Plan: pending   Consultants:   none  Procedures:   none  Antimicrobials:  Anti-infectives (From admission, onward)   Start     Dose/Rate Route Frequency Ordered Stop   09/03/17 1200  valACYclovir (VALTREX) tablet 1,000 mg     1,000 mg Oral 2 times daily 09/03/17 1131 09/08/17 2159   09/02/17 2200  vancomycin (VANCOCIN) IVPB 1000 mg/200 mL premix     1,000 mg 200 mL/hr over 60 Minutes Intravenous Every 36 hours 09/02/17 0028     09/02/17 0300  valACYclovir (VALTREX) tablet 1,000 mg  Status:  Discontinued     1,000 mg Oral 3 times daily 09/01/17 2215 09/01/17 2218   09/01/17 2300  valACYclovir (VALTREX) tablet 1,000 mg  Status:  Discontinued     1,000 mg Oral 3 times daily 09/01/17 2218 09/03/17 1131   09/01/17 1945  valACYclovir (VALTREX) tablet 500 mg  Status:  Discontinued     500 mg Oral Once 09/01/17 1939 09/01/17 2218   09/01/17 1915  vancomycin (VANCOCIN) IVPB 1000 mg/200 mL premix     1,000 mg 200 mL/hr over 60 Minutes Intravenous  Once 09/01/17 1907 09/01/17 2218       Subjective: Denies pain initially, but has significant pain with movement and rolling.   Objective: Vitals:   09/03/17 1236 09/03/17 2045 09/04/17 0615 09/04/17 1710  BP: (!) 106/49 (!) 100/54 104/60 110/62  Pulse: 76 70 74 72  Resp: 18 14 12 16   Temp: 98 F (36.7 C) 98.2 F (36.8 C) 98.4 F (36.9 C) 98.2 F (36.8 C)  TempSrc: Axillary Axillary Axillary Axillary  SpO2: 100% 100% 100% 100%  Weight:      Height:        Intake/Output Summary (Last 24 hours) at 09/04/2017 1737 Last data filed at 09/04/2017 1345 Gross per 24 hour  Intake 700 ml  Output -  Net 700 ml   Filed Weights   09/02/17 1934  Weight: 56.9 kg (125 lb 7.1 oz)    Examination:  General: No acute distress. Cardiovascular: Heart sounds  show Omunique Pederson regular rate, and rhythm. No gallops or rubs. No murmurs. No JVD. Lungs: Clear to auscultation bilaterally with good air movement. No rales, rhonchi or wheezes. Abdomen: Soft, nontender, nondistended with normal active bowel sounds. No masses. No hepatosplenomegaly. Neurological: Alert.  Cranial nerves II through XII grossly intact. Skin: Erythema appears stable, with scattered areas of ulceration and crusting.  Purulence noted to L hip.  Extremities: No clubbing or cyanosis. No edema.   Data Reviewed: I have personally reviewed following labs and imaging studies  CBC: Recent Labs  Lab 09/01/17 1543 09/02/17 0327 09/03/17 0350 09/03/17 1334 09/04/17 0314  WBC 1.6* 1.5* 1.1* 1.0* 1.4*  NEUTROABS 1.0*  --   --  0.2* 0.3*  HGB 13.3 11.7* 13.1 13.2 13.0  HCT 40.7 36.0 40.0 40.2 38.8  MCV 92.9 94.0 93.2 92.2 92.2  PLT 186 179 201 204 829   Basic Metabolic Panel: Recent Labs  Lab 09/01/17 1543 09/02/17 0327 09/03/17 0350 09/04/17 0314  NA 138 137 136 133*  K 4.9 3.6 3.8 3.7  CL 104 106 102 100*  CO2 25 23 25 26   GLUCOSE 102* 108* 105* 112*  BUN 20 15 9 13   CREATININE 0.69 0.61 0.61 0.69  CALCIUM 8.6* 8.2* 8.7* 8.5*  MG  --  1.9  --  2.0  PHOS  --  2.5  --   --    GFR: Estimated Creatinine Clearance: 40.4 mL/min (by C-G formula based on SCr of 0.69 mg/dL). Liver Function Tests: Recent Labs  Lab 09/02/17 0327 09/04/17 0314  AST 22 28  ALT 21 24  ALKPHOS 116 151*  BILITOT 1.3* 1.9*  PROT 6.3* 6.8  ALBUMIN 2.9* 2.9*   No results for input(s): LIPASE, AMYLASE in the last 168 hours. No results for input(s): AMMONIA in the last 168 hours. Coagulation Profile: No results for input(s): INR, PROTIME in the last 168 hours. Cardiac Enzymes: No results for input(s): CKTOTAL, CKMB, CKMBINDEX, TROPONINI in the last 168 hours. BNP (last 3 results) No results for input(s): PROBNP in the last 8760 hours. HbA1C: No results for input(s): HGBA1C in the last 72  hours. CBG: Recent Labs  Lab 09/03/17 0805 09/03/17 1112 09/03/17 1709  GLUCAP 85 103* 95   Lipid Profile: No results for input(s): CHOL, HDL, LDLCALC, TRIG, CHOLHDL, LDLDIRECT in the last 72 hours. Thyroid Function Tests: Recent Labs    09/02/17 0327  TSH 1.736   Anemia Panel: No results for input(s): VITAMINB12, FOLATE, FERRITIN, TIBC, IRON, RETICCTPCT in the last 72 hours. Sepsis Labs: Recent Labs  Lab 09/01/17 1722  LATICACIDVEN 1.25    Recent Results (from the past 240 hour(s))  Blood culture (routine x 2)     Status: None (Preliminary result)   Collection Time: 09/01/17  7:48 PM  Result Value Ref Range Status   Specimen Description   Final  BLOOD LEFT ANTECUBITAL Performed at [facility] [address], [place], [place] [address]    Special Requests   Final    BOTTLES DRAWN AEROBIC AND ANAEROBIC Blood Culture adequate volume Performed at [facility] [address], [place], [place] [address]    Culture   Final    NO GROWTH 3 DAYS Performed at [facility], [place] [address] [place], [place] [address]    Report Status PENDING  Incomplete  Blood culture (routine x 2)     Status: None (Preliminary result)   Collection Time: 09/01/17  7:48 PM  Result Value Ref Range Status   Specimen Description   Final    BLOOD RIGHT FOREARM Performed at [facility] [address], [place], [place] [address]    Special Requests   Final    BOTTLES DRAWN AEROBIC AND ANAEROBIC Blood Culture adequate volume Performed at [facility] [address] [place], [place] [address]    Culture   Final    NO GROWTH 3 DAYS Performed at [facility], [place] [address] [place], [place] [address]    Report Status PENDING  Incomplete  MRSA PCR Screening     Status: None   Collection Time: 09/02/17  3:27 AM  Result Value Ref Range Status   MRSA by PCR NEGATIVE NEGATIVE Final    Comment:         The GeneXpert MRSA Assay (FDA approved for NASAL specimens only), is one component of Kamauri Denardo comprehensive MRSA colonization surveillance program. It is not intended to diagnose MRSA infection nor to guide or monitor treatment for MRSA infections. Performed at [facility], [place] [address] [place], [place] [address]          Radiology Studies: No results found.      Scheduled Meds: . acetaminophen  1,000 mg Oral Q8H  . aspirin EC  81 mg Oral Daily  . enoxaparin (LOVENOX) injection  40 mg Subcutaneous Q24H  . gabapentin  100 mg Oral QHS  . hydrocerin   Topical BID  . valACYclovir  1,000 mg Oral BID   Continuous Infusions: . vancomycin Stopped (09/04/17 1209)     LOS: 3 days    Time spent: over 13 min    Fayrene Helper, MD [facility] Pager [phone]  If 7PM-7AM, please contact night-coverage [email or web address] Password [facility] 09/04/2017, 5:37 PM

## 2017-09-04 NOTE — Care Management Important Message (Signed)
Important Message  Patient Details IM Letter given to Nora/Case Manager to present to the Patient Name: Jill Flowers MRN: [id number] Date of Birth: January 26, 1931   Medicare Important Message Given:  Yes    Kerin Salen 09/04/2017, 12:02 PM

## 2017-09-05 LAB — CBC WITH DIFFERENTIAL/PLATELET
BASOS PCT: 0 %
Basophils Absolute: 0 10*3/uL (ref 0.0–0.1)
EOS PCT: 4 %
Eosinophils Absolute: 0 10*3/uL (ref 0.0–0.7)
HEMATOCRIT: 36.5 % (ref 36.0–46.0)
HEMOGLOBIN: 12 g/dL (ref 12.0–15.0)
LYMPHS PCT: 38 %
Lymphs Abs: 0.5 10*3/uL — ABNORMAL LOW (ref 0.7–4.0)
MCH: 30.3 pg (ref 26.0–34.0)
MCHC: 32.9 g/dL (ref 30.0–36.0)
MCV: 92.2 fL (ref 78.0–100.0)
Monocytes Absolute: 0.3 10*3/uL (ref 0.1–1.0)
Monocytes Relative: 26 %
NEUTROS PCT: 32 %
Neutro Abs: 0.4 10*3/uL — ABNORMAL LOW (ref 1.7–7.7)
Platelets: 210 10*3/uL (ref 150–400)
RBC: 3.96 MIL/uL (ref 3.87–5.11)
RDW: 13.4 % (ref 11.5–15.5)
WBC: 1.2 10*3/uL — CL (ref 4.0–10.5)

## 2017-09-05 LAB — COMPREHENSIVE METABOLIC PANEL
ALBUMIN: 2.7 g/dL — AB (ref 3.5–5.0)
ALT: 31 U/L (ref 14–54)
ANION GAP: 6 (ref 5–15)
AST: 38 U/L (ref 15–41)
Alkaline Phosphatase: 139 U/L — ABNORMAL HIGH (ref 38–126)
BUN: 15 mg/dL (ref 6–20)
CO2: 27 mmol/L (ref 22–32)
Calcium: 8.5 mg/dL — ABNORMAL LOW (ref 8.9–10.3)
Chloride: 103 mmol/L (ref 101–111)
Creatinine, Ser: 0.91 mg/dL (ref 0.44–1.00)
GFR calc Af Amer: >60 mL/min (ref >60)
GFR calc non Af Amer: 56 mL/min — ABNORMAL LOW (ref >60)
GLUCOSE: 109 mg/dL — AB (ref 65–99)
POTASSIUM: 4 mmol/L (ref 3.5–5.1)
SODIUM: 136 mmol/L (ref 135–145)
Total Bilirubin: 1.2 mg/dL (ref 0.3–1.2)
Total Protein: 6.5 g/dL (ref 6.5–8.1)

## 2017-09-05 LAB — CBC
HCT: 37 % (ref 36.0–46.0)
HEMOGLOBIN: 11.9 g/dL — AB (ref 12.0–15.0)
MCH: 29.8 pg (ref 26.0–34.0)
MCHC: 32.2 g/dL (ref 30.0–36.0)
MCV: 92.5 fL (ref 78.0–100.0)
Platelets: 216 10*3/uL (ref 150–400)
RBC: 4 MIL/uL (ref 3.87–5.11)
RDW: 13.4 % (ref 11.5–15.5)
WBC: 1.1 10*3/uL — AB (ref 4.0–10.5)

## 2017-09-05 LAB — MAGNESIUM: Magnesium: 2.1 mg/dL (ref 1.7–2.4)

## 2017-09-05 MED ORDER — MORPHINE SULFATE (PF) 2 MG/ML IV SOLN
1.0000 mg | Freq: Once | INTRAVENOUS | Status: AC
  Administered 2017-09-05: 1 mg via INTRAVENOUS

## 2017-09-05 MED ORDER — JUVEN PO PACK
1.0000 | PACK | Freq: Two times a day (BID) | ORAL | Status: DC
  Administered 2017-09-05 – 2017-09-07 (×5): 1 via ORAL
  Filled 2017-09-05 (×5): qty 1

## 2017-09-05 NOTE — Progress Notes (Signed)
**Note De-Identified vi Obfusction** PROGRESS NOTE    Jill Flowers  DQQ:[id number] DOB: October 27, 1930 DOA: 09/01/2017 PCP: Mury Dus, MD   Brief Nrrtive:  Jill Flowers is  84 yo femle with pst medicl history of dementi, history of lymphom (not on tretment, but followed by oncology), history of spin bifid with chronic git difficulty, chronic distolic CHF, chronic venous stsis. She presents with skin lesions, redness nd ulcertions bilterl posterior legs tht extends to inner thighs. It ws felt to be possible herpes zoster with superimposed purulent cellulitis. Ptient hs hd recurrent shingles in her legs in the pst. She ws dmitted to the hospitl nd strted on IV vnco nd vlcyclovir.   Assessment & Pln:   Principl Problem:   Cellulitis Active Problems:   GERD   Spin bifid ([place])   COPD (chronic obstructive pulmonry disese) (HCC)   Chronic distolic CHF (congestive hert filure) (HCC)   Pressure ulcer   Skin mcertion   Shingles   Leukopeni   Herpes zoster with superimposed purulent cellulitis - continued extensive erythem to buttocks nd down bilterl thighs, no res of fluctunce on my exm, but there ws purulence (noted on L hip).  Exm tody seems stble with miniml improvement.   - Blood cultures NGTD x 4 - Continue vncomycin (2/25 - ) - Vlcyclovir (2/25 - ) - Wound RN consult, pprecite recs - oxycodone prn pin (morphine for brekthrough).  Scheduled APAP.  Strt gbpentin.  Will try to give oxycodone prior to moving or djusting her in bed s this is when she hs the most pin.    Leukopeni  Neutropeni: Apprecite oncology recommendtions.  Suspect chronic benign neutropeni.  Continue supportive mngement.  Growth fctor only if cler signs of sepsis.  Chronic distolic CHF -Stble   COPD -Stble without excerbtion  Dementi with behviorl disturbnce - delirium precutions - follow EKG (norml qtc) - prn hldol ordered for gittion  History of  Spin Bifid: she ws wlking with  wlker lst yer, but sounds like she's declined since hospitliztion in April of 2018.  Her dughter notes she hsn't been wlking since then.  PT/OT ordered.    Elevted lk phos  bili: mild, follow   DVT prophylxis: lovenox Code Sttus: full  Fmily Communiction: dughter t bedside Disposition Pln: pending   Consultnts:   none  Procedures:   none  Antimicrobils:  Anti-infectives (From dmission, onwrd)   Strt     Dose/Rte Route Frequency Ordered Stop   09/03/17 1200  vlACYclovir (VALTREX) tblet 1,000 mg     1,000 mg Orl 2 times dily 09/03/17 1131 09/08/17 2159   09/02/17 2200  vncomycin (VANCOCIN) IVPB 1000 mg/200 mL premix     1,000 mg 200 mL/hr over 60 Minutes Intrvenous Every 36 hours 09/02/17 0028     09/02/17 0300  vlACYclovir (VALTREX) tblet 1,000 mg  Sttus:  Discontinued     1,000 mg Orl 3 times dily 09/01/17 2215 09/01/17 2218   09/01/17 2300  vlACYclovir (VALTREX) tblet 1,000 mg  Sttus:  Discontinued     1,000 mg Orl 3 times dily 09/01/17 2218 09/03/17 1131   09/01/17 1945  vlACYclovir (VALTREX) tblet 500 mg  Sttus:  Discontinued     500 mg Orl Once 09/01/17 1939 09/01/17 2218   09/01/17 1915  vncomycin (VANCOCIN) IVPB 1000 mg/200 mL premix     1,000 mg 200 mL/hr over 60 Minutes Intrvenous  Once 09/01/17 1907 09/01/17 2218       Subjective: Feeling ok. Dughter notes she getting **Note De-Identified vi Obfusction** closer to her bseline. Significnt discomfort with movement.   Objective: Vitls:   09/04/17 0615 09/04/17 1710 09/04/17 2317 09/05/17 0443  BP: 104/60 110/62 126/74 113/64  Pulse: 74 72 94 76  Resp: 12 16 16 16   Temp: 98.4 F (36.9 C) 98.2 F (36.8 C) (!) 97.5 F (36.4 C) 97.7 F (36.5 C)  TempSrc: Axillry Axillry Orl Orl  SpO2: 100% 100% 98% 97%  Weight:      Height:        Intke/Output Summry (Lst 24 hours) t 09/05/2017 1213 Lst dt filed t 09/04/2017 1900 Gross per 24 hour  Intke 590  ml  Output -  Net 590 ml   Filed Weights   09/02/17 1934  Weight: 56.9 kg (125 lb 7.1 oz)    Exmintion:  Generl: No cute distress. Crdiovsculr: Hert sounds show  regulr rte, nd rhythm. No gllops or rubs. No murmurs. No JVD. Lungs: Cler to usculttion bilterlly with good ir movement. No rles, rhonchi or wheezes. Abdomen: Soft, nontender, nondistended with norml ctive bowel sounds. No msses. No heptosplenomegly. Neurologicl: Alert. Crnil nerves II through XII grossly intct. Skin: Erythem with ulcertion, purulence to L hip.  Scttered ulcertions nd scle nd diffuse erythem not notbly different thn yesterdy.  Extremities: No clubbing or cynosis. No edem.    Dt Reviewed: I hve personlly reviewed following lbs nd imging studies  CBC: Recent Lbs  Lb 09/01/17 1543 09/02/17 0327 09/03/17 0350 09/03/17 1334 09/04/17 0314 09/05/17 0355  WBC 1.6* 1.5* 1.1* 1.0* 1.4* 1.1*  NEUTROABS 1.0*  --   --  0.2* 0.3*  --   HGB 13.3 11.7* 13.1 13.2 13.0 11.9*  HCT 40.7 36.0 40.0 40.2 38.8 37.0  MCV 92.9 94.0 93.2 92.2 92.2 92.5  PLT 186 179 201 204 201 638   Bsic Metbolic Pnel: Recent Lbs  Lb 09/01/17 1543 09/02/17 0327 09/03/17 0350 09/04/17 0314 09/05/17 0355  NA 138 137 136 133* 136  K 4.9 3.6 3.8 3.7 4.0  CL 104 106 102 100* 103  CO2 25 23 25 26 27   GLUCOSE 102* 108* 105* 112* 109*  BUN 20 15 9 13 15   CREATININE 0.69 0.61 0.61 0.69 0.91  CALCIUM 8.6* 8.2* 8.7* 8.5* 8.5*  MG  --  1.9  --  2.0 2.1  PHOS  --  2.5  --   --   --    GFR: Estimted Cretinine Clernce: 35.5 mL/min (by C-G formul bsed on SCr of 0.91 mg/dL). Liver Function Tests: Recent Lbs  Lb 09/02/17 0327 09/04/17 0314 09/05/17 0355  AST 22 28 38  ALT 21 24 31   ALKPHOS 116 151* 139*  BILITOT 1.3* 1.9* 1.2  PROT 6.3* 6.8 6.5  ALBUMIN 2.9* 2.9* 2.7*   No results for input(s): LIPASE, AMYLASE in the lst 168 hours. No results for input(s): AMMONIA  in the lst 168 hours. Cogultion Profile: No results for input(s): INR, PROTIME in the lst 168 hours. Crdic Enzymes: No results for input(s): CKTOTAL, CKMB, CKMBINDEX, TROPONINI in the lst 168 hours. BNP (lst 3 results) No results for input(s): PROBNP in the lst 8760 hours. HbA1C: No results for input(s): HGBA1C in the lst 72 hours. CBG: Recent Lbs  Lb 09/03/17 0805 09/03/17 1112 09/03/17 1709  GLUCAP 85 103* 95   Lipid Profile: No results for input(s): CHOL, HDL, LDLCALC, TRIG, CHOLHDL, LDLDIRECT in the lst 72 hours. Thyroid Function Tests: No results for input(s): TSH, T4TOTAL, FREET4, T3FREE, THYROIDAB in the lst 72 hours. Anemi **Note De-Identified vi Obfusction** Pnel: No results for input(s): VITAMINB12, FOLATE, FERRITIN, TIBC, IRON, RETICCTPCT in the lst 72 hours. Sepsis Lbs: Recent Lbs  Lb 09/01/17 1722  LATICACIDVEN 1.25    Recent Results (from the pst 240 hour(s))  Blood culture (routine x 2)     Sttus: None (Preliminry result)   Collection Time: 09/01/17  7:48 PM  Result Vlue Ref Rnge Sttus   Specimen Description   Finl    BLOOD LEFT ANTECUBITAL Performed t [facility] [address] [place], [place] [address]    Specil Requests   Finl    BOTTLES DRAWN AEROBIC AND ANAEROBIC Blood Culture dequte volume Performed t [facility] [address], [place], [place] [address]    Culture   Finl    NO GROWTH 4 DAYS Performed t [facility], [place] [address] [place], [place] [address]    Report Sttus PENDING  Incomplete  Blood culture (routine x 2)     Sttus: None (Preliminry result)   Collection Time: 09/01/17  7:48 PM  Result Vlue Ref Rnge Sttus   Specimen Description   Finl    BLOOD RIGHT FOREARM Performed t [facility] [address] [place], [place] [address]    Specil Requests   Finl    BOTTLES DRAWN AEROBIC AND ANAEROBIC Blood Culture dequte volume Performed t [facility] [address], [place], [place] [address]    Culture   Finl    NO GROWTH 4 DAYS Performed t [facility], [place] [address] [place], [place] [address]    Report Sttus PENDING  Incomplete  MRSA PCR Screening     Sttus: None   Collection Time: 09/02/17  3:27 AM  Result Vlue Ref Rnge Sttus   MRSA by PCR NEGATIVE NEGATIVE Finl    Comment:        The GeneXpert MRSA Assy (FDA pproved for NASAL specimens only), is one component of  comprehensive MRSA coloniztion surveillnce progrm. It is not intended to dignose MRSA infection nor to guide or monitor tretment for MRSA infections. Performed t [facility], [address], [place],  [address]          Rdiology Studies: No results found.      Scheduled Meds: . cetminophen  1,000 mg Orl Q8H  . spirin EC  81 mg Orl Dily  . enoxprin (LOVENOX) injection  40 mg Subcutneous Q24H  . gbpentin  100 mg Orl QHS  . hydrocerin   Topicl BID  . vlACYclovir  1,000 mg Orl BID   Continuous Infusions: . vncomycin Stopped (09/04/17 1209)     LOS: 4 dys    Time spent: over 29 min    Fyrene Helper, MD [facility] [phone]  If 7PM-7AM, plese contct night-coverge [email or web address] [facility] 09/05/2017, 12:13 PM

## 2017-09-05 NOTE — Progress Notes (Signed)
Initial Nutrition Assessment  DOCUMENTATION CODES:   Not applicable  INTERVENTION:   - 1 packet Juven BID, each packet provides 80 calories, 8 grams of carbohydrate, and 14 grams of amino acids; supplement contains CaHMB, glutamine, and arginine, to promote wound healing  Please provide Juven with 8-10 ounces of water.  NUTRITION DIAGNOSIS:   Increased nutrient needs related to wound healing as evidenced by estimated needs.  GOAL:   Patient will meet greater than or equal to 90% of their needs  MONITOR:   PO intake, Supplement acceptance, Skin, I & O's  REASON FOR ASSESSMENT:   Low Braden   ASSESSMENT:   84 year old female with PMH significant for dementia, lymphoma not on treatment but followed by oncology, spina bifida with chronic gait difficulty, CHF, COPD, GERD, and chronic venous stasis. Admitted for cellulitis.  Spoke with pt and daughter at bedside. Pt receiving lunch tray at time of RD visit. Pt's daughter states pt has a "great" appetite and eats several times daily. Pt lives with her daughter who grocery shops and cleans. Pt's daughter states a typical breakfast for the pt might include cereal and milk. A typical lunch might include a sandwich. A typical dinner might include steak with sides. Pt's PO intake yesterday was between 50-100%.  Pt and pt's daughter are agreeable to pt receiving Juven daily to aid in wound healing.  Pt's daughter denies any recent weight loss and states that pt has been gaining weight. Pt's daughter is unsure of pt's UBW.  I/O's: +2.6 L  Medications reviewed.  Labs reviewed: alkaline phosphatase 139 (H) CBG's: 95, 103, 85 (on 09/03/17)  NUTRITION - FOCUSED PHYSICAL EXAM:    Most Recent Value  Orbital Region  No depletion  Upper Arm Region  No depletion  Thoracic and Lumbar Region  Unable to assess  Buccal Region  No depletion  Temple Region  Mild depletion  Clavicle Bone Region  Mild depletion  Clavicle and Acromion Bone  Region  Mild depletion  Scapular Bone Region  Unable to assess  Dorsal Hand  Unable to assess  Patellar Region  Moderate depletion  Anterior Thigh Region  Mild depletion  Posterior Calf Region  Mild depletion  Edema (RD Assessment)  None  Hair  Reviewed  Eyes  Reviewed  Mouth  Reviewed  Skin  Reviewed  Nails  Reviewed       Diet Order:  DIET SOFT Room service appropriate? Yes; Fluid consistency: Thin  EDUCATION NEEDS:   No education needs have been identified at this time  Skin:  Skin Assessment: Skin Integrity Issues: Skin Integrity Issues:: Incisions, Other (Comment)(cellulitis) Incisions: left buttock, bilateral thighs Other: buttocks  Last BM:  09/01/17  Height:   Ht Readings from Last 1 Encounters:  09/02/17 4' 11.84" (1.52 m)    Weight:   Wt Readings from Last 1 Encounters:  09/02/17 125 lb 7.1 oz (56.9 kg)    Ideal Body Weight:  45.5 kg  BMI:  Body mass index is 24.63 kg/m.  Estimated Nutritional Needs:   Kcal:  1500-1700 kcal/day  Protein:  70-85 grams/day  Fluid:  1.5-1.7 L/day    Gaynell Face, MS, RD, LDN Pager: [phone] Weekend/After Hours: [phone]

## 2017-09-05 NOTE — Progress Notes (Signed)
Pharmacy Antibiotic Note  Jill Flowers is a 84 y.o. female admitted on 09/01/2017 with cellulitis.  Pharmacy has been consulted for vancomycin  Dosing. She has h/o venous stasis.   PCN allergy with swelling but has tolerated Cephalosporins  09/05/2017  Remains neutropenic (chronic). SCr 0.69>0.91, Afebrile.   Plan:  Vancomycin 1gm IV q36h continues  Valtrex 1000 mg bid for shingles  No vancomycin levels as anticipate DC soon  Temp (24hrs), Avg:97.8 F (36.6 C), Min:97.5 F (36.4 C), Max:98.2 F (36.8 C)  Recent Labs  Lab 09/01/17 1543 09/01/17 1722 09/02/17 0327 09/03/17 0350 09/03/17 1334 09/04/17 0314 09/05/17 0355  WBC 1.6*  --  1.5* 1.1* 1.0* 1.4* 1.1*  CREATININE 0.69  --  0.61 0.61  --  0.69 0.91  LATICACIDVEN  --  1.25  --   --   --   --   --     Estimated Creatinine Clearance: 35.5 mL/min (by C-G formula based on SCr of 0.91 mg/dL).    Allergies  Allergen Reactions  . Dilaudid [Hydromorphone Hcl]   . Hydrocodone-Acetaminophen     REACTION: N \\T \ V  . Penicillins     REACTION: swelling. Received cephalosporins in past Has patient had a PCN reaction causing immediate rash, facial/tongue/throat swelling, SOB or lightheadedness with hypotension: Yes Has patient had a PCN reaction causing severe rash involving mucus membranes or skin necrosis: YES Has patient had a PCN reaction that required hospitalization No Has patient had a PCN reaction occurring within the last 10 years: No If all of the above answers are "NO", then may proceed with Cephalosporin use.   . Tramadol     Antimicrobials this admission: 2/25 vanco >> 2/25 cefepime x 1  Dose adjustments this admission:  Microbiology results: 2/25 BCx: NGTD 2/26 MRSA PCR: neg   Thank you for allowing pharmacy to be a part of this patient's care. Eudelia Bunch, Pharm.D. [phone] 09/05/2017 10:03 AM

## 2017-09-05 NOTE — Progress Notes (Signed)
PT Cancellation Note  Patient Details Name: Jill Flowers MRN: [id number] DOB: [birth year]   Cancelled Treatment:    Reason Eval/Treat Not Completed: PT screened, no needs identified, will sign off. PT has attempted evaluation several times and patient has  Been lethargic or in so much pain. PT has spoken to daughte, as indicated in previous notes, patient requires total assist PTA. PT will sign off at this time.   Claretha Cooper 09/05/2017, 6:58 AM  Tresa Endo PT [phone]

## 2017-09-06 ENCOUNTER — Inpatient Hospital Stay (HOSPITAL_COMMUNITY): Payer: Medicare Other

## 2017-09-06 ENCOUNTER — Other Ambulatory Visit: Payer: Self-pay

## 2017-09-06 LAB — CBC WITH DIFFERENTIAL/PLATELET
BASOS ABS: 0 10*3/uL (ref 0.0–0.1)
BASOS PCT: 0 %
Eosinophils Absolute: 0.1 10*3/uL (ref 0.0–0.7)
Eosinophils Relative: 9 %
HEMATOCRIT: 35.3 % — AB (ref 36.0–46.0)
HEMOGLOBIN: 11.3 g/dL — AB (ref 12.0–15.0)
LYMPHS PCT: 53 %
Lymphs Abs: 0.6 10*3/uL — ABNORMAL LOW (ref 0.7–4.0)
MCH: 30 pg (ref 26.0–34.0)
MCHC: 32 g/dL (ref 30.0–36.0)
MCV: 93.6 fL (ref 78.0–100.0)
Monocytes Absolute: 0.3 10*3/uL (ref 0.1–1.0)
Monocytes Relative: 26 %
NEUTROS ABS: 0.2 10*3/uL — AB (ref 1.7–7.7)
NEUTROS PCT: 12 %
Platelets: 231 10*3/uL (ref 150–400)
RBC: 3.77 MIL/uL — AB (ref 3.87–5.11)
RDW: 13.5 % (ref 11.5–15.5)
WBC: 1.2 10*3/uL — AB (ref 4.0–10.5)

## 2017-09-06 LAB — COMPREHENSIVE METABOLIC PANEL
ALT: 41 U/L (ref 14–54)
ANION GAP: 7 (ref 5–15)
AST: 53 U/L — AB (ref 15–41)
Albumin: 2.8 g/dL — ABNORMAL LOW (ref 3.5–5.0)
Alkaline Phosphatase: 147 U/L — ABNORMAL HIGH (ref 38–126)
BUN: 20 mg/dL (ref 6–20)
CHLORIDE: 103 mmol/L (ref 101–111)
CO2: 27 mmol/L (ref 22–32)
Calcium: 8.6 mg/dL — ABNORMAL LOW (ref 8.9–10.3)
Creatinine, Ser: 0.81 mg/dL (ref 0.44–1.00)
GFR calc Af Amer: >60 mL/min (ref >60)
Glucose, Bld: 118 mg/dL — ABNORMAL HIGH (ref 65–99)
POTASSIUM: 4.4 mmol/L (ref 3.5–5.1)
Sodium: 137 mmol/L (ref 135–145)
Total Bilirubin: 1 mg/dL (ref 0.3–1.2)
Total Protein: 6.2 g/dL — ABNORMAL LOW (ref 6.5–8.1)

## 2017-09-06 LAB — CULTURE, BLOOD (ROUTINE X 2)
CULTURE: NO GROWTH
Culture: NO GROWTH
Special Requests: ADEQUATE
Special Requests: ADEQUATE

## 2017-09-06 LAB — MAGNESIUM: MAGNESIUM: 2.1 mg/dL (ref 1.7–2.4)

## 2017-09-06 MED ORDER — SODIUM CHLORIDE 0.9 % IJ SOLN
INTRAMUSCULAR | Status: AC
  Filled 2017-09-06: qty 50

## 2017-09-06 MED ORDER — IOPAMIDOL (ISOVUE-300) INJECTION 61%
INTRAVENOUS | Status: AC
  Filled 2017-09-06: qty 100

## 2017-09-06 MED ORDER — IOPAMIDOL (ISOVUE-300) INJECTION 61%
100.0000 mL | Freq: Once | INTRAVENOUS | Status: AC | PRN
  Administered 2017-09-06: 100 mL via INTRAVENOUS

## 2017-09-06 MED ORDER — SODIUM CHLORIDE 0.9 % IV SOLN
INTRAVENOUS | Status: DC
  Administered 2017-09-06: 18:00:00 via INTRAVENOUS

## 2017-09-06 NOTE — Progress Notes (Signed)
PROGRESS NOTE    TOWANA STENGLEIN  GDJ:[id number] DOB: 1931-01-20 DOA: 09/01/2017 PCP: Maury Dus, MD   Brief Narrative:  Jill Flowers is Jill Flowers 84 yo female with past medical history of dementia, history of lymphoma (not on treatment, but followed by oncology), history of spina bifida with chronic gait difficulty, chronic diastolic CHF, chronic venous stasis. She presents with skin lesions, redness and ulcerations bilateral posterior legs that extends to inner thighs. It was felt to be possible herpes zoster with superimposed purulent cellulitis. Patient has had recurrent shingles in her legs in the past. She was admitted to the hospital and started on IV vanco and valcyclovir.   Assessment & Plan:   Principal Problem:   Cellulitis Active Problems:   GERD   Spina bifida ([place])   COPD (chronic obstructive pulmonary disease) (HCC)   Chronic diastolic CHF (congestive heart failure) (HCC)   Pressure ulcer   Skin maceration   Shingles   Leukopenia   Herpes zoster with superimposed purulent cellulitis - continued extensive erythema to buttocks and down bilateral thighs, no areas of fluctuance on my exam, but there was purulence to wounds on bilateral hips.  (worse on L hip).  Exam today seems stable with minimal improvement in ulcerations, but pain does seem improved.    - Blood cultures NGTD x 5 - Continue vancomycin (2/25 - ) - Valcyclovir (2/25 - ) - Will slow improvement, will f/u CT pelvis for deep tissue infection or osteo - Wound RN consult, appreciate recs - oxycodone prn pain (morphine for breakthrough).  Scheduled APAP.  Start gabapentin.  Will try to give oxycodone prior to moving or adjusting her in bed as this is when she has the most pain.    Leukopenia  Neutropenia: Appreciate oncology recommendations.  Suspect chronic benign neutropenia.  Continue supportive management.  Growth factor only if clear signs of sepsis.  Chronic diastolic CHF -Stable   COPD -Stable without  exacerbation  Dementia with behavioral disturbance - delirium precautions - follow EKG (normal qtc) - prn haldol ordered for agitation  History of Spina Bifida: she was walking with Jesyka Slaght walker last year, but sounds like she's declined since hospitalization in April of 2018.  Her daughter notes she hasn't been walking since then.  PT/OT ordered.    Elevated alk phos  bili: mild, follow   DVT prophylaxis: lovenox Code Status: full  Family Communication: daughter at bedside Disposition Plan: pending   Consultants:   none  Procedures:   none  Antimicrobials:  Anti-infectives (From admission, onward)   Start     Dose/Rate Route Frequency Ordered Stop   09/03/17 1200  valACYclovir (VALTREX) tablet 1,000 mg     1,000 mg Oral 2 times daily 09/03/17 1131 09/08/17 2159   09/02/17 2200  vancomycin (VANCOCIN) IVPB 1000 mg/200 mL premix     1,000 mg 200 mL/hr over 60 Minutes Intravenous Every 36 hours 09/02/17 0028     09/02/17 0300  valACYclovir (VALTREX) tablet 1,000 mg  Status:  Discontinued     1,000 mg Oral 3 times daily 09/01/17 2215 09/01/17 2218   09/01/17 2300  valACYclovir (VALTREX) tablet 1,000 mg  Status:  Discontinued     1,000 mg Oral 3 times daily 09/01/17 2218 09/03/17 1131   09/01/17 1945  valACYclovir (VALTREX) tablet 500 mg  Status:  Discontinued     500 mg Oral Once 09/01/17 1939 09/01/17 2218   09/01/17 1915  vancomycin (VANCOCIN) IVPB 1000 mg/200 mL premix  1,000 mg 200 mL/hr over 60 Minutes Intravenous  Once 09/01/17 1907 09/01/17 2218       Subjective: Pain better today. Denies pain when lying in bed.  Objective: Vitals:   09/05/17 1814 09/05/17 2122 09/06/17 0619 09/06/17 1418  BP: (!) 136/46 135/62 (!) 117/59 (!) 130/58  Pulse: 87 97 82 72  Resp: _0 Temp: 97.6 F (36.4 C) 99.8 F (37.7 C) 98 F (36.7 C) 97.9 F (36.6 C)  TempSrc: Axillary Oral Oral Axillary  SpO2: 97% 98% 99% 97%  Weight:   54.9 kg (121 lb 0.5 oz)   Height:         Intake/Output Summary (Last 24 hours) at 09/06/2017 1749 Last data filed at 09/06/2017 1449 Gross per 24 hour  Intake 1000 ml  Output -  Net 1000 ml   Filed Weights   09/02/17 1934 09/06/17 0619  Weight: 56.9 kg (125 lb 7.1 oz) 54.9 kg (121 lb 0.5 oz)    Examination:  General: No acute distress. Cardiovascular: Heart sounds show Joniece Smotherman regular rate, and rhythm. No gallops or rubs. No murmurs. No JVD. Lungs: Clear to auscultation bilaterally with good air movement. No rales, rhonchi or wheezes. Abdomen: Soft, nontender, nondistended with normal active bowel sounds. No masses. No hepatosplenomegaly. Neurological: Alert.  Cranial nerves II through XII grossly intact. Skin: Erythema to buttocks with uclerations with crusting scale.  Ulcerations to bilateral hip with purulence L>R.   Extremities: No clubbing or cyanosis. No edema.  Psychiatric: Mood and affect are normal. Insight and judgment are appropriate.   Data Reviewed: I have personally reviewed following labs and imaging studies  CBC: Recent Labs  Lab 09/01/17 1543  09/03/17 1334 09/04/17 0314 09/05/17 0355 09/05/17 1253 09/06/17 0352  WBC 1.6*   < > 1.0* 1.4* 1.1* 1.2* 1.2*  NEUTROABS 1.0*  --  0.2* 0.3*  --  0.4* 0.2*  HGB 13.3   < > 13.2 13.0 11.9* 12.0 11.3*  HCT 40.7   < > 40.2 38.8 37.0 36.5 35.3*  MCV 92.9   < > 92.2 92.2 92.5 92.2 93.6  PLT 186   < > 204 201 216 210 231   < > = values in this interval not displayed.   Basic Metabolic Panel: Recent Labs  Lab 09/02/17 0327 09/03/17 0350 09/04/17 0314 09/05/17 0355 09/06/17 0352  NA 137 136 133* 136 137  K 3.6 3.8 3.7 4.0 4.4  CL 106 102 100* 103 103  CO2 _1 GLUCOSE 108* 105* 112* 109* 118*  BUN _2 CREATININE 0.61 0.61 0.69 0.91 0.81  CALCIUM 8.2* 8.7* 8.5* 8.5* 8.6*  MG 1.9  --  2.0 2.1 2.1  PHOS 2.5  --   --   --   --    GFR: Estimated Creatinine Clearance: 39.3 mL/min (by C-G formula based on SCr of 0.81 mg/dL). Liver  Function Tests: Recent Labs  Lab 09/02/17 0327 09/04/17 0314 09/05/17 0355 09/06/17 0352  AST 22 28 38 53*  ALT _3 41  ALKPHOS 116 151* 139* 147*  BILITOT 1.3* 1.9* 1.2 1.0  PROT 6.3* 6.8 6.5 6.2*  ALBUMIN 2.9* 2.9* 2.7* 2.8*   No results for input(s): LIPASE, AMYLASE in the last 168 hours. No results for input(s): AMMONIA in the last 168 hours. Coagulation Profile: No results for input(s): INR, PROTIME in the last 168 hours. Cardiac Enzymes: No results for input(s): CKTOTAL, CKMB, CKMBINDEX, TROPONINI in  the last 168 hours. BNP (last 3 results) No results for input(s): PROBNP in the last 8760 hours. HbA1C: No results for input(s): HGBA1C in the last 72 hours. CBG: Recent Labs  Lab 09/03/17 0805 09/03/17 1112 09/03/17 1709  GLUCAP 85 103* 95   Lipid Profile: No results for input(s): CHOL, HDL, LDLCALC, TRIG, CHOLHDL, LDLDIRECT in the last 72 hours. Thyroid Function Tests: No results for input(s): TSH, T4TOTAL, FREET4, T3FREE, THYROIDAB in the last 72 hours. Anemia Panel: No results for input(s): VITAMINB12, FOLATE, FERRITIN, TIBC, IRON, RETICCTPCT in the last 72 hours. Sepsis Labs: Recent Labs  Lab 09/01/17 1722  LATICACIDVEN 1.25    Recent Results (from the past 240 hour(s))  Blood culture (routine x 2)     Status: None   Collection Time: 09/01/17  7:48 PM  Result Value Ref Range Status   Specimen Description   Final    BLOOD LEFT ANTECUBITAL Performed at [facility] [address] [place], [place] [address]    Special Requests   Final    BOTTLES DRAWN AEROBIC AND ANAEROBIC Blood Culture adequate volume Performed at [facility] [address], [place], [place] [address]    Culture   Final    NO GROWTH 5 DAYS Performed at [facility], [place] [address] [place], [place] [address]    Report Status 09/06/2017 FINAL  Final  Blood culture (routine x 2)     Status: None   Collection Time: 09/01/17  7:48 PM    Result Value Ref Range Status   Specimen Description   Final    BLOOD RIGHT FOREARM Performed at [facility] [address], [place], [place] [address]    Special Requests   Final    BOTTLES DRAWN AEROBIC AND ANAEROBIC Blood Culture adequate volume Performed at [facility] [address], [place], [place] [address]    Culture   Final    NO GROWTH 5 DAYS Performed at [facility], [place] [address], [place],  [address]    Report Status 09/06/2017 FINAL  Final  MRSA PCR Screening     Status: None   Collection Time: 09/02/17  3:27 AM  Result Value Ref Range Status   MRSA by PCR NEGATIVE NEGATIVE Final    Comment:        The GeneXpert MRSA Assay (FDA approved for NASAL specimens only), is one component of Blease Capaldi comprehensive MRSA colonization surveillance program. It is not intended to diagnose MRSA infection nor to guide or monitor treatment for MRSA infections. Performed at [facility], [place] [address], [place],  [address]          Radiology Studies: No results found.      Scheduled Meds: . acetaminophen  1,000 mg Oral Q8H  . aspirin EC  81 mg Oral Daily  . enoxaparin (LOVENOX) injection  40 mg Subcutaneous Q24H  . gabapentin  100 mg Oral QHS  . hydrocerin   Topical BID  . nutrition supplement (JUVEN)  1 packet Oral BID BM  . valACYclovir  1,000 mg Oral BID   Continuous Infusions: . vancomycin Stopped (09/05/17 2309)     LOS: 5 days    Time spent: over 51 min    Fayrene Helper, MD [facility] Pager [phone]  If 7PM-7AM, please contact night-coverage [email or web address] Password [facility] 09/06/2017, 5:49 PM

## 2017-09-07 LAB — CBC
HCT: 38.2 % (ref 36.0–46.0)
HEMOGLOBIN: 12.3 g/dL (ref 12.0–15.0)
MCH: 30 pg (ref 26.0–34.0)
MCHC: 32.2 g/dL (ref 30.0–36.0)
MCV: 93.2 fL (ref 78.0–100.0)
Platelets: 256 10*3/uL (ref 150–400)
RBC: 4.1 MIL/uL (ref 3.87–5.11)
RDW: 13.3 % (ref 11.5–15.5)
WBC: 1.5 10*3/uL — AB (ref 4.0–10.5)

## 2017-09-07 LAB — COMPREHENSIVE METABOLIC PANEL
ALBUMIN: 2.9 g/dL — AB (ref 3.5–5.0)
ALT: 43 U/L (ref 14–54)
AST: 41 U/L (ref 15–41)
Alkaline Phosphatase: 146 U/L — ABNORMAL HIGH (ref 38–126)
Anion gap: 7 (ref 5–15)
BUN: 29 mg/dL — AB (ref 6–20)
CHLORIDE: 101 mmol/L (ref 101–111)
CO2: 28 mmol/L (ref 22–32)
CREATININE: 0.84 mg/dL (ref 0.44–1.00)
Calcium: 8.9 mg/dL (ref 8.9–10.3)
GFR calc Af Amer: >60 mL/min (ref >60)
GFR calc non Af Amer: >60 mL/min (ref >60)
GLUCOSE: 137 mg/dL — AB (ref 65–99)
Potassium: 4.4 mmol/L (ref 3.5–5.1)
SODIUM: 136 mmol/L (ref 135–145)
Total Bilirubin: 0.7 mg/dL (ref 0.3–1.2)
Total Protein: 6.7 g/dL (ref 6.5–8.1)

## 2017-09-07 MED ORDER — JUVEN PO PACK: 1.0000 | PACK | Freq: Two times a day (BID) | ORAL | 0 refills | Status: AC

## 2017-09-07 MED ORDER — ACETAMINOPHEN 500 MG PO TABS: 1000.0000 mg | ORAL_TABLET | Freq: Three times a day (TID) | ORAL | 0 refills | Status: AC

## 2017-09-07 MED ORDER — POLYETHYLENE GLYCOL 3350 17 G PO PACK: 17.0000 g | PACK | Freq: Two times a day (BID) | ORAL | 0 refills | Status: AC

## 2017-09-07 MED ORDER — OXYCODONE HCL 5 MG PO TABS: 5.0000 mg | ORAL_TABLET | Freq: Four times a day (QID) | ORAL | 0 refills | Status: AC | PRN

## 2017-09-07 MED ORDER — DOXYCYCLINE HYCLATE 100 MG PO CAPS: 100.0000 mg | ORAL_CAPSULE | Freq: Two times a day (BID) | ORAL | 0 refills | Status: AC

## 2017-09-07 MED ORDER — GABAPENTIN 100 MG PO CAPS: 100.0000 mg | ORAL_CAPSULE | Freq: Every day | ORAL | 0 refills | Status: AC

## 2017-09-07 MED ORDER — POLYETHYLENE GLYCOL 3350 17 G PO PACK
17.0000 g | PACK | Freq: Every day | ORAL | Status: DC
  Administered 2017-09-07: 17 g via ORAL
  Filled 2017-09-07: qty 1

## 2017-09-07 MED ORDER — HYDROCERIN EX CREA: 1.0000 "application " | TOPICAL_CREAM | Freq: Two times a day (BID) | CUTANEOUS | 0 refills | Status: AC

## 2017-09-07 MED ORDER — VALACYCLOVIR HCL 1 G PO TABS: 1000.0000 mg | ORAL_TABLET | Freq: Two times a day (BID) | ORAL | 0 refills | Status: AC

## 2017-09-07 NOTE — Progress Notes (Signed)
Pt. discharged to home, left via [facility]. No respiratory distress noted. Family left earlier to be home for when the pt. arrived.

## 2017-09-07 NOTE — Progress Notes (Signed)
Discharge teaching completed with patient and daughter. Instructed and demonstrated wound care for daughter, she states she understands procedure. Clean wound with normal saline and gauze, apply barrier cream then dress with foam pad daily. [place] set up by case management. Daughter understands to call Monday to schedule follow up appointment with PCP for this coming week. Understands to pick medications up at [facility]. Discharge instructions reviewed and given to daughter.

## 2017-09-07 NOTE — Progress Notes (Signed)
Family states they are unable to transport pt. home in their truck. Non emergent transport called for ride.

## 2017-09-07 NOTE — Discharge Summary (Signed)
Physician Discharge Summary  [facility] AGT:[id number] DOB: [birth year] DOA: 09/01/2017  PCP: Maury Dus, MD  Admit date: 09/01/2017 Discharge date: 09/07/2017  Time spent: over 30 minutes  Recommendations for Outpatient Follow-up:  1. Follow up outpatient CBC/CMP 2. Follow up bilateral hip wounds.  Discharged with wound care instructions and with home health RN for wound care, but follow up with PCP for further recommendations. 3. Follow up with PCP for cellulitis.  D/c'd with 10 additional days of doxycycline, but course can be shortened or prolonged based on healing.  Will finish valtrex tomorrow (3/4). 4. Follow up with heme/onc or PCP as outpatient for leukocytopenia.  Per heme/onc here, suspect chronic benign neutropenia.  5. Small volume pelvic fluid noted on CT, unclear etiology.  No abdominal pain.  Follow up as outpatient per PCP.    Discharge Diagnoses:  Principal Problem:   Cellulitis Active Problems:   GERD   Spina bifida ([place])   COPD (chronic obstructive pulmonary disease) (HCC)   Chronic diastolic CHF (congestive heart failure) (HCC)   Pressure ulcer   Skin maceration   Shingles   Leukopenia   Discharge Condition: stable  Diet recommendation: soft diet  Filed Weights   09/02/17 1934 09/06/17 0619  Weight: 56.9 kg (125 lb 7.1 oz) 54.9 kg (121 lb 0.5 oz)    History of present illness:  Per PN Thomas J Howleis [facility] 84 yo female with past medical history ofdementia, history of lymphoma(not on treatment, but followed by oncology), history of spina bifida with chronic gait difficulty, chronic diastolic CHF, chronic venous stasis. She presents with skin lesions, redness and ulcerations bilateral posterior legs that extends to inner thighs. It was felt to be possible herpes zoster with superimposed purulent cellulitis. Patient has had recurrent shingles in her legs in the past. She was admitted to the hospital and started on IV vanco and valcyclovir.  She was treated  with valtrex and IV vancomycin.  On the day of discharge she had improved pain and redness.  She had persistent wounds to bilateral hips and recommended outpatient follow up for these.   Hospital Course:  Herpes zoster with superimposed purulent cellulitis - Redness has improved at discharge as well as pain.  She still has purulent wounds to bilateral hips which are relatively stable.  CT on 3/2 was without evidence of osteo or abscess or septic joint.  She did not have any areas of fluctuance on exam.  Discharged on 3/3 with course of doxycycline given purulent cellulitis.   - Blood cultures NGTD x 5 - Continue vancomycin (2/25 - 3/3).  Discharged with doxycycline for 10 days.   - Valcyclovir (2/25 - 3/4) - Wound RN consult, see recommendations below - Discharged with oxycodone, APAP, and gabapentin.   Leukopenia  Neutropenia: Appreciate oncology recommendations.  Suspect chronic benign neutropenia.  Continue supportive management.  Growth factor only if clear signs of sepsis.  Chronic diastolic CHF -Stable   COPD -Stable without exacerbation  Dementia with behavioral disturbance - delirium precautions - follow EKG (normal qtc) - prn haldol ordered for agitation  History of Spina Bifida: she was walking with Azuree Minish walker last year, but sounds like she's declined since hospitalization in April of 2018.  Her daughter notes she hasn't been walking since then. Discharged home with home health, patient was requiring total assist prior to presentation at home.   Elevated alk phos  bili: resolved   Procedures:  none   Consultations:  Heme/onc  Discharge Exam: Vitals:  09/07/17 0627 09/07/17 1421  BP: 122/64 (!) 120/50  Pulse: 85 76  Resp: 14 14  Temp: 98.7 F (37.1 C) 97.8 F (36.6 C)  SpO2: 98% 97%   Feeling better.  Looking forward to going home.  General: No acute distress. Cardiovascular: Heart sounds show Tranell Wojtkiewicz regular rate, and rhythm. No gallops or rubs. No  murmurs. No JVD. Lungs: Clear to auscultation bilaterally with good air movement. No rales, rhonchi or wheezes. Abdomen: Soft, nontender, nondistended with normal active bowel sounds. No masses. No hepatosplenomegaly. Neurological: Alert. Cranial nerves II through XII grossly intact. Skin: Erythema seems to be improving.  Persistent ulceration to bilateral hips L>R with purulent drainage.   Extremities: No clubbing or cyanosis. No edema.  Psychiatric: Mood and affect are normal. Insight and judgment are appropriate.   Discharge Instructions   Discharge Instructions    Call MD for:  extreme fatigue   Complete by:  As directed    Call MD for:  persistant dizziness or light-headedness   Complete by:  As directed    Call MD for:  persistant nausea and vomiting   Complete by:  As directed    Call MD for:  redness, tenderness, or signs of infection (pain, swelling, redness, odor or green/yellow discharge around incision site)   Complete by:  As directed    Call MD for:  temperature >100.4   Complete by:  As directed    DIET SOFT   Complete by:  As directed    Discharge instructions   Complete by:  As directed    You were seen for cellulitis and shingles.  Your pain has gradually improved and the redness has improved, but you still have wounds on bilateral hips that need to be cared for closely.    I'll send you home with 2 doses of valacyclovir (take 1 tonight and 1 tomorrow).    I'll send you home with another 10 days of antibiotics for the cellulitis.  Follow up with your PCP within the next few days to determine whether this course can be shortened or needs to be extended.  We're sending home health with you to help with wound care.  Please follow wound care instructions included.    I'll send her home with some oxycodone for pain.  Only take this when you're having pain.  You have chronically low white blood counts.  It is important to let your doctor know if you have Dashon Mcintire  fever.  Return if you have new, recurrent, or worsening symptoms.  Please have your PCP request records so they know what was done and the next steps.   Discharge wound care:   Complete by:  As directed    Skin care to bilateral buttocks, with attention to left hip, lower extremity and medial thigh with full and partial thickness lesions:  Cleanse with house skin cleanser (normal saline), pat gently but thoroughly dry.  Apply moisture barrier (purple top, Critic Aid clear) to affected areas. Turn and reposition from side to side and minimize time spent in the supine position.   Increase activity slowly   Complete by:  As directed      Allergies as of 09/07/2017      Reactions   Dilaudid [hydromorphone Hcl]    Hydrocodone-acetaminophen    REACTION: N \T\ V   Penicillins    REACTION: swelling. Received cephalosporins in past Has patient had Trek Kimball PCN reaction causing immediate rash, facial/tongue/throat swelling, SOB or lightheadedness with hypotension: Yes Has patient  had Tasneem Cormier PCN reaction causing severe rash involving mucus membranes or skin necrosis: YES Has patient had Quentin Shorey PCN reaction that required hospitalization No Has patient had Khaleah Duer PCN reaction occurring within the last 10 years: No If all of the above answers are "NO", then may proceed with Cephalosporin use.   Tramadol       Medication List    TAKE these medications   acetaminophen 500 MG tablet Commonly known as:  TYLENOL Take 2 tablets (1,000 mg total) by mouth every 8 (eight) hours.   aspirin EC 81 MG tablet Take 81 mg by mouth daily.   doxycycline 100 MG capsule Commonly known as:  VIBRAMYCIN Take 1 capsule (100 mg total) by mouth 2 (two) times daily for 10 days. (please follow up with PCP to ensure you should take entire course) Start taking on:  09/08/2017   gabapentin 100 MG capsule Commonly known as:  NEURONTIN Take 1 capsule (100 mg total) by mouth at bedtime.   hydrocerin Crea Apply 1 application topically 2 (two)  times daily.   ibuprofen 200 MG tablet Commonly known as:  ADVIL,MOTRIN Take 400 mg by mouth at bedtime as needed for moderate pain.   nutrition supplement (JUVEN) Pack Take 1 packet by mouth 2 (two) times daily between meals.   oxyCODONE 5 MG immediate release tablet Commonly known as:  Oxy IR/ROXICODONE Take 1 tablet (5 mg total) by mouth every 6 (six) hours as needed for up to 5 days for moderate pain.   polyethylene glycol packet Commonly known as:  MIRALAX / GLYCOLAX Take 17 g by mouth daily as needed for mild constipation.   valACYclovir 1000 MG tablet Commonly known as:  VALTREX Take 1 tablet (1,000 mg total) by mouth 2 (two) times daily for 2 doses. (1 dose tonight on 09/07/17 and 1 dose tomorrow morning 09/08/17)   Zinc Oxide 12.8 % ointment Commonly known as:  TRIPLE PASTE Apply topically as needed for irritation.            Discharge Care Instructions  (From admission, onward)        Start     Ordered   09/07/17 0000  Discharge wound care:    Comments:  Skin care to bilateral buttocks, with attention to left hip, lower extremity and medial thigh with full and partial thickness lesions:  Cleanse with house skin cleanser (normal saline), pat gently but thoroughly dry.  Apply moisture barrier (purple top, Critic Aid clear) to affected areas. Turn and reposition from side to side and minimize time spent in the supine position.   09/07/17 1421     Allergies  Allergen Reactions  . Dilaudid [Hydromorphone Hcl]   . Hydrocodone-Acetaminophen     REACTION: N \T\ V  . Penicillins     REACTION: swelling. Received cephalosporins in past Has patient had Landry Kamath PCN reaction causing immediate rash, facial/tongue/throat swelling, SOB or lightheadedness with hypotension: Yes Has patient had Shaheer Bonfield PCN reaction causing severe rash involving mucus membranes or skin necrosis: YES Has patient had Raylei Losurdo PCN reaction that required hospitalization No Has patient had Dru Primeau PCN reaction occurring within  the last 10 years: No If all of the above answers are "NO", then may proceed with Cephalosporin use.   . Tramadol       The results of significant diagnostics from this hospitalization (including imaging, microbiology, ancillary and laboratory) are listed below for reference.    Significant Diagnostic Studies: Dg Pelvis 1-2 Views  Result Date: 09/01/2017 CLINICAL DATA:  Multiple superficial wounds to the lower extremities EXAM: PELVIS - 1-2 VIEW COMPARISON:  Radiograph 07/05/2004 FINDINGS: SI joint degenerative changes. Pubic symphysis and rami are intact. Both femoral necks are obscured by the trochanters. No gross fracture or malalignment. No obvious bony erosive changes. IMPRESSION: No acute osseous abnormality allowing for suboptimal positioning. Electronically Signed   By: Donavan Foil M.D.   On: 09/01/2017 16:33   Ct Pelvis W Contrast  Result Date: 09/06/2017 CLINICAL DATA:  Patient admitted with skin lesions redness and ulcerations on the posterior aspect of both legs extending to the inner thighs. Question osteomyelitis. History of spina bifida. EXAM: CT PELVIS WITH CONTRAST TECHNIQUE: Multidetector CT imaging of the pelvis was performed using the standard protocol following the bolus administration of intravenous contrast. CONTRAST:  100 ml ISOVUE-300 IOPAMIDOL (ISOVUE-300) INJECTION 61% COMPARISON:  CT abdomen and pelvis 07/03/2004. FINDINGS: There is some infiltration of subcutaneous fat over the right greater trochanter and inferior left buttock/upper thigh. No rim enhancing fluid collection to suggest abscess is identified. No hip joint effusion is noted. No evidence of bursitis is seen. Imaged musculature is intact. No intramuscular fluid collection. Fat planes within muscle are preserved. Fatty atrophy of posterior paraspinous musculature is unchanged since 2005. No soft tissue gas or radiopaque foreign body. Bones are osteopenic. No bony destructive change or periosteal reaction is  seen. No fracture. Degenerative disease lower lumbar spine appears worst at L4-5. Absence of the posterior elements at L4-5 and L5-S1 is compatible with the patient's history of spina bifida. Imaged intrapelvic contents demonstrate Dejanae Helser small volume of simple appearing ascites. Extensive atherosclerosis is noted. Multiple collateral vessels are seen in the subcutaneous tissues of the anterior pelvis anteriorly. IMPRESSION: Scattered areas of infiltration of subcutaneous fat compatible with cellulitis. No abscess or evidence of septic joint or osteomyelitis. Small volume of simple appearing free pelvic fluid of unknown etiology. Atherosclerosis. Electronically Signed   By: Inge Rise M.D.   On: 09/06/2017 21:35    Microbiology: Recent Results (from the past 240 hour(s))  Blood culture (routine x 2)     Status: None   Collection Time: 09/01/17  7:48 PM  Result Value Ref Range Status   Specimen Description   Final    BLOOD LEFT ANTECUBITAL Performed at [facility], [place] [address] [place], [place] [address]    Special Requests   Final    BOTTLES DRAWN AEROBIC AND ANAEROBIC Blood Culture adequate volume Performed at [facility] [address], [place], [place] [address]    Culture   Final    NO GROWTH 5 DAYS Performed at [facility], [place] [address], [place], [place] [address]    Report Status 09/06/2017 FINAL  Final  Blood culture (routine x 2)     Status: None   Collection Time: 09/01/17  7:48 PM  Result Value Ref Range Status   Specimen Description   Final    BLOOD RIGHT FOREARM Performed at [facility] [address] [place], [place] [address]    Special Requests   Final    BOTTLES DRAWN AEROBIC AND ANAEROBIC Blood Culture adequate volume Performed at [facility] [address] [place], [place] [address]    Culture   Final    NO GROWTH 5 DAYS Performed at [facility], [place]  [address] [place], [place] [address]    Report Status 09/06/2017 FINAL  Final  MRSA PCR Screening     Status:  None   Collection Time: 09/02/17  3:27 AM  Result Value Ref Range Status   MRSA by PCR NEGATIVE NEGATIVE Final    Comment:        The GeneXpert MRSA Assay (FDA approved for NASAL specimens only), is one component of [employer] comprehensive MRSA colonization surveillance program. It is not intended to diagnose MRSA infection nor to guide or monitor treatment for MRSA infections. Performed at [facility], [place] [address] [place], [place] [address]      Labs: Basic Metabolic Panel: Recent Labs  Lab 09/02/17 0327 09/03/17 0350 09/04/17 0314 09/05/17 0355 09/06/17 0352 09/07/17 0349  NA 137 136 133* 136 137 136  K 3.6 3.8 3.7 4.0 4.4 4.4  CL 106 102 100* 103 103 101  CO2 23 25 26 27 27 28   GLUCOSE 108* 105* 112* 109* 118* 137*  BUN 15 9 13 15 20  29*  CREATININE 0.61 0.61 0.69 0.91 0.81 0.84  CALCIUM 8.2* 8.7* 8.5* 8.5* 8.6* 8.9  MG 1.9  --  2.0 2.1 2.1  --   PHOS 2.5  --   --   --   --   --    Liver Function Tests: Recent Labs  Lab 09/02/17 0327 09/04/17 0314 09/05/17 0355 09/06/17 0352 09/07/17 0349  AST 22 28 38 53* 41  ALT 21 24 31  41 43  ALKPHOS 116 151* 139* 147* 146*  BILITOT 1.3* 1.9* 1.2 1.0 0.7  PROT 6.3* 6.8 6.5 6.2* 6.7  ALBUMIN 2.9* 2.9* 2.7* 2.8* 2.9*   No results for input(s): LIPASE, AMYLASE in the last 168 hours. No results for input(s): AMMONIA in the last 168 hours. CBC: Recent Labs  Lab 09/01/17 1543  09/03/17 1334 09/04/17 0314 09/05/17 0355 09/05/17 1253 09/06/17 0352 09/07/17 0349  WBC 1.6*   < > 1.0* 1.4* 1.1* 1.2* 1.2* 1.5*  NEUTROABS 1.0*  --  0.2* 0.3*  --  0.4* 0.2*  --   HGB 13.3   < > 13.2 13.0 11.9* 12.0 11.3* 12.3  HCT 40.7   < > 40.2 38.8 37.0 36.5 35.3* 38.2  MCV 92.9   < > 92.2 92.2 92.5 92.2 93.6 93.2  PLT 186   < > 204 201 216 210 231 256   < > = values in this interval not displayed.   Cardiac  Enzymes: No results for input(s): CKTOTAL, CKMB, CKMBINDEX, TROPONINI in the last 168 hours. BNP: BNP (last 3 results) No results for input(s): BNP in the last 8760 hours.  ProBNP (last 3 results) No results for input(s): PROBNP in the last 8760 hours.  CBG: Recent Labs  Lab 09/03/17 0805 09/03/17 1112 09/03/17 1709  GLUCAP 85 103* 95       Signed:  Fayrene Helper MD.  [facility] 09/07/2017, 2:23 PM

## 2017-09-07 NOTE — Care Management Note (Signed)
Case Management Note  Patient Details  Name: Jill [facility] MRN: [id number] Date of Birth: 1931-06-01  Subjective/Objective:   Shingles, cellulitis, CHF                 Action/Plan: NCM spoke to pt and dtr, Freda [place]. Offered choice for [facility]. Pt agreeable to [facility] for [facility]. Contacted [facility] with new referral. Pt has RW, and bedside commode at home.    Expected Discharge Date:  09/07/17               Expected Discharge Plan:  [place]  In-House Referral:  NA  Discharge planning Services  CM Consult  Post Acute Care Choice:  Home Health Choice offered to:  Adult Children  DME Arranged:  N/A DME Agency:  NA  HH Arranged:  PT, RN, Nurse's Aide, Social Work [employer] Agency:  [place]  Status of Service:  Completed, signed off  If discussed at [employer], dates discussed:    Additional Comments:  Erenest Rasher, RN 09/07/2017, 4:42 PM

## 2017-12-06 DEATH — deceased
